# Patient Record
Sex: Female | Born: 1955
Health system: Southern US, Community
[De-identification: ages and names within clinical notes are randomized; demographics above are authoritative.]

## PROBLEM LIST (undated history)

## (undated) DIAGNOSIS — K08409 Partial loss of teeth, unspecified cause, unspecified class: Secondary | ICD-10-CM

## (undated) DIAGNOSIS — R748 Abnormal levels of other serum enzymes: Secondary | ICD-10-CM

## (undated) DIAGNOSIS — J449 Chronic obstructive pulmonary disease, unspecified: Secondary | ICD-10-CM

## (undated) DIAGNOSIS — E782 Mixed hyperlipidemia: Secondary | ICD-10-CM

## (undated) DIAGNOSIS — Z720 Tobacco use: Secondary | ICD-10-CM

## (undated) DIAGNOSIS — I10 Essential (primary) hypertension: Secondary | ICD-10-CM

## (undated) HISTORY — DX: Abnormal levels of other serum enzymes: R74.8

## (undated) HISTORY — PX: ABDOMINAL HYSTERECTOMY: SHX81

## (undated) HISTORY — DX: Partial loss of teeth, unspecified cause, unspecified class: K08.409

## (undated) HISTORY — DX: Mixed hyperlipidemia: E78.2

## (undated) HISTORY — DX: Essential (primary) hypertension: I10

---

## 2005-12-01 ENCOUNTER — Emergency Department (HOSPITAL_COMMUNITY): Admission: EM | Admit: 2005-12-01 | Discharge: 2005-12-01 | Payer: Self-pay | Admitting: Emergency Medicine

## 2006-05-08 ENCOUNTER — Ambulatory Visit (HOSPITAL_COMMUNITY): Admission: RE | Admit: 2006-05-08 | Discharge: 2006-05-08 | Payer: Self-pay | Admitting: Family Medicine

## 2006-05-29 ENCOUNTER — Ambulatory Visit (HOSPITAL_COMMUNITY): Admission: RE | Admit: 2006-05-29 | Discharge: 2006-05-29 | Payer: Self-pay | Admitting: Family Medicine

## 2007-02-10 ENCOUNTER — Ambulatory Visit: Payer: Self-pay | Admitting: Gastroenterology

## 2007-02-10 ENCOUNTER — Ambulatory Visit (HOSPITAL_COMMUNITY): Admission: RE | Admit: 2007-02-10 | Discharge: 2007-02-10 | Payer: Self-pay | Admitting: Gastroenterology

## 2007-02-19 ENCOUNTER — Ambulatory Visit (HOSPITAL_COMMUNITY): Admission: RE | Admit: 2007-02-19 | Discharge: 2007-02-19 | Payer: Self-pay | Admitting: Urology

## 2007-03-11 ENCOUNTER — Ambulatory Visit (HOSPITAL_COMMUNITY): Admission: RE | Admit: 2007-03-11 | Discharge: 2007-03-11 | Payer: Self-pay | Admitting: Family Medicine

## 2007-03-19 ENCOUNTER — Encounter (INDEPENDENT_AMBULATORY_CARE_PROVIDER_SITE_OTHER): Payer: Self-pay | Admitting: *Deleted

## 2007-03-19 ENCOUNTER — Ambulatory Visit (HOSPITAL_COMMUNITY): Admission: RE | Admit: 2007-03-19 | Discharge: 2007-03-19 | Payer: Self-pay | Admitting: Obstetrics & Gynecology

## 2007-06-27 ENCOUNTER — Ambulatory Visit (HOSPITAL_COMMUNITY): Admission: RE | Admit: 2007-06-27 | Discharge: 2007-06-27 | Payer: Self-pay | Admitting: Family Medicine

## 2007-12-19 ENCOUNTER — Ambulatory Visit (HOSPITAL_COMMUNITY): Admission: RE | Admit: 2007-12-19 | Discharge: 2007-12-19 | Payer: Self-pay | Admitting: Family Medicine

## 2008-07-20 ENCOUNTER — Ambulatory Visit (HOSPITAL_COMMUNITY): Admission: RE | Admit: 2008-07-20 | Discharge: 2008-07-20 | Payer: Self-pay | Admitting: Internal Medicine

## 2009-07-25 ENCOUNTER — Ambulatory Visit (HOSPITAL_COMMUNITY): Admission: RE | Admit: 2009-07-25 | Discharge: 2009-07-25 | Payer: Self-pay | Admitting: Family Medicine

## 2010-06-21 ENCOUNTER — Ambulatory Visit (HOSPITAL_COMMUNITY): Admission: RE | Admit: 2010-06-21 | Discharge: 2010-06-21 | Payer: Self-pay | Admitting: Family Medicine

## 2010-09-15 ENCOUNTER — Ambulatory Visit (HOSPITAL_COMMUNITY): Admission: RE | Admit: 2010-09-15 | Discharge: 2010-09-15 | Payer: Self-pay | Admitting: Family Medicine

## 2011-04-27 NOTE — H&P (Signed)
Cynthia [name], Cynthia [name]              ACCOUNT NO.:  [id number]   MEDICAL RECORD NO.:  [id number]          PATIENT TYPE:  AMB   LOCATION:  DAY                           FACILITY:  [facility]   PHYSICIAN:  Lazaro Arms, M.D.   DATE OF BIRTH:  September 23, 1956   DATE OF ADMISSION:  DATE OF DISCHARGE:  LH                              HISTORY & PHYSICAL   DATE OF SURGERY:  March 19, 2007.   HISTORY OF PRESENT ILLNESS:  Cynthia [name] is a 55 year old white female,  gravida 0, para 0, status post a hysterectomy about 20 years ago, who  has been having right lower quadrant pain intermittently the last  several months.  She went to see [facility] and she  ended up having a CT scan and ultrasound which showed a right ovarian  mass, otherwise was negative.  She has about a 4.5 cm right ovarian mass  complex, could be hemorrhagic corpus luteum, could not rule out a  malignancy.  However, there is no other free fluid.  There is no  studding, no other findings consistent.  She has had a hysterectomy and  removal of left tube and ovary at that time.  A CA-125 was drawn  preoperatively and we will proceed with a laparoscopic right salpingo-  oophorectomy.   PAST MEDICAL HISTORY:  Significant for hypertension.   MEDICATIONS:  She takes Toprol 50 mg and hydrochlorothiazide 25 mg.   PAST SURGICAL HISTORY:  She had a growth on her bowel that she had  resected and also a hysterectomy in the distant past.   REVIEW OF SYSTEMS:  Otherwise negative.   HABITS:  She smokes one to two packs of cigarettes a day.   SOCIAL HISTORY:  She works for the [employer].   PHYSICAL EXAMINATION:  VITAL SIGNS:  She is 5 feet, 6 inches, 180  pounds.  Blood pressure today in the office is 120/80.  HEENT:  Unremarkable.  NECK:  Thyroid is normal.  LUNGS:  Clear.  HEART:  Has regular rate and rhythm without murmurs, rubs or gallops.  BREASTS: Deferred.  ABDOMEN:  Benign.  PELVIC:  Deferred.  EXTREMITIES:  No edema.   CLINICAL DATA:  Ultrasound reveals the cyst and uterus and ovary absent.   IMPRESSION:  Painful complex right ovarian mass.   PLAN:  The patient is admitted for a laparoscopic right salpingo-  oophorectomy.  She understands the indications, benefits and risks and  will proceed.      Lazaro Arms, M.D.  Electronically Signed     LHE/MEDQ  D:  03/18/2007  T:  03/18/2007  Job:  [id number]

## 2011-04-27 NOTE — Op Note (Signed)
NAMEMAKAYLIN, CARLO              ACCOUNT NO.:  [id number]   MEDICAL RECORD NO.:  [id number]          PATIENT TYPE:  AMB   LOCATION:  DAY                           FACILITY:  [facility]   PHYSICIAN:  Kassie Mends, M.D.      DATE OF BIRTH:  15-Jan-1956   DATE OF PROCEDURE:  DATE OF DISCHARGE:                               OPERATIVE REPORT   PROCEDURE:  Colonoscopy.   INDICATION FOR EXAMINATION:  The patient is a 55 year old female who  presents for average risk colon cancer screening.   FINDINGS:  1. Normal colon without evidence of polyps, masses, diverticula,      inflammatory changes or AVMs.  2. Normal retroflexed view of the rectum.   RECOMMENDATIONS:  1. High-fiber diet.  The patient is given a handout on a high-fiber      diet.  The high-fiber diet is associated with a decreased risk of      colon polyps and colon cancer.  No Hemoccult testing for 5 years.  2. Screening colonoscopy in 10 years.  3. Follow up with Ms. Melony Overly.   MEDICATIONS:  1. Demerol 50 mg IV.  2. Versed 3 mg IV.   PROCEDURE TECHNIQUE:  Physical exam was performed and informed consent  was obtained from the patient after explaining the benefits, risks and  alternatives to the procedure.  The patient was connected to the monitor  and placed in the left lateral position.  Continuous oxygen was provided  by nasal cannula and IV medicine administered through the indwelling  cannula.  After administration of sedation and rectal exam, the  patient's rectum was intubated and the scope was advanced under direct  visualization to the cecum.  The scope was subsequently removed slowly  by carefully examining the color, texture, anatomy and integrity of the  mucosa on the way out.  The patient was recovered in the endoscopy suite  and discharged to home in satisfactory condition.      Kassie Mends, M.D.  Electronically Signed    SM/MEDQ  D:  02/10/2007  T:  02/10/2007  Job:  [id number]   cc:   Melony Overly,  PA

## 2011-04-27 NOTE — Op Note (Signed)
[name], Cynthia              ACCOUNT NO.:  [id number]   MEDICAL RECORD NO.:  [id number]          PATIENT TYPE:  AMB   LOCATION:  DAY                           FACILITY:  [facility]   PHYSICIAN:  Lazaro Arms, M.D.   DATE OF BIRTH:  09-Feb-1956   DATE OF PROCEDURE:  03/19/2007  DATE OF DISCHARGE:                               OPERATIVE REPORT   PREOPERATIVE DIAGNOSES:  1. Complex right ovarian tumor by ultrasound.  2. Right lower quadrant pain.   POSTOPERATIVE DIAGNOSES:  1. Complex right ovarian tumor by ultrasound.  2. Right lower quadrant pain.   PROCEDURE:  Right salpingo-oophorectomy.   SURGEON:  Lazaro Arms, M.D.   ANESTHESIA:  General endotracheal.   FINDINGS:  Patient is seen at [facility] and had right  lower quadrant.  Did a CT followed by an ultrasound that revealed a  complex right ovarian mass.  There were no other abnormalities on the  scan to suggest malignancy.  A CA-125 was done in our office, which has  returned as normal.  As a result, we will proceed with laparoscopic RSO.  At the time of surgery, it appears to be maybe a stromal cell tumor.  It  certainly does not appear to be malignant.  There are no excrescences.  No peritoneal studding.  No ascitic fluid, and the omentum appears to be  normal.  She did have adhesions from her previous bowel surgery in the  past.   DESCRIPTION OF OPERATION:  Patient was taken to the operating room,  placed in a supine position, underwent general endotracheal anesthesia.  Placed in the dorsal lithotomy position.  Prepped and draped in the  usual sterile fashion for laparoscopic procedure and a Foley catheter  was placed.  An incision was made in the umbilicus.  A Veress needle was  used.  The peritoneal cavity was insufflated.  Once the insufflation was  obtained, a non-bladed 11 mm trocar was used under direct visualization  with the video laparoscope.  Placed in the peritoneal cavity with one  pass without difficulty.  The peritoneal cavity was visualized.  Then 5  mm trocars were placed two fingerbreadths above the pubis in the midline  and also in the right lower quadrant under direct visualization without  difficulty.  The right ovary was identified.  It was adherent to some  bowel epiploic as well as pelvic side wall.  It was elevated under  traction.  The harmonic scalpel was used.  The infundibulopelvic  ligament was cauterized using the harmonic scalpel.  The adhesions were  then taken down from the epiploic.  Bowel was really not in the area at  all.  There was no chance of damage to that.  It was removed.  It was  placed in an EndoCatch bag and removed intact.  All epiploic was found  to be hemostatic.  Pelvis was irrigated.  The instruments were removed.  The gas was allowed to escape.  The umbilical fascia was closed with two  0 Vicryl sutures.  Subcutaneous tissue was also closed here, and the  skin  was closed using the skin staples x3.  The patient tolerated the  procedure well.  She experienced minimal blood loss, taken to the  recovery room in good, stable condition.  All counts were correct x3.  She received Ancef and Toradol prophylactically.  I will see her in the  office next week.      Lazaro Arms, M.D.  Electronically Signed     LHE/MEDQ  D:  03/19/2007  T:  03/19/2007  Job:  [id number]

## 2011-08-28 ENCOUNTER — Other Ambulatory Visit (HOSPITAL_COMMUNITY): Payer: Self-pay | Admitting: Family Medicine

## 2011-08-28 DIAGNOSIS — Z139 Encounter for screening, unspecified: Secondary | ICD-10-CM

## 2011-09-18 ENCOUNTER — Ambulatory Visit (HOSPITAL_COMMUNITY)
Admission: RE | Admit: 2011-09-18 | Discharge: 2011-09-18 | Disposition: A | Discharge status: Home / Self Care | Payer: Federal, State, Local not specified - PPO | Source: Ambulatory Visit | Attending: Family Medicine | Admitting: Family Medicine

## 2011-09-18 DIAGNOSIS — Z1231 Encounter for screening mammogram for malignant neoplasm of breast: Secondary | ICD-10-CM | POA: Insufficient documentation

## 2011-09-18 DIAGNOSIS — Z139 Encounter for screening, unspecified: Secondary | ICD-10-CM

## 2012-08-22 ENCOUNTER — Other Ambulatory Visit (HOSPITAL_COMMUNITY): Payer: Self-pay | Admitting: Family Medicine

## 2012-08-22 DIAGNOSIS — Z139 Encounter for screening, unspecified: Secondary | ICD-10-CM

## 2012-09-19 ENCOUNTER — Ambulatory Visit (HOSPITAL_COMMUNITY)
Admission: RE | Admit: 2012-09-19 | Discharge: 2012-09-19 | Disposition: A | Discharge status: Home / Self Care | Payer: Federal, State, Local not specified - PPO | Source: Ambulatory Visit | Attending: Family Medicine | Admitting: Family Medicine

## 2012-09-19 DIAGNOSIS — Z1231 Encounter for screening mammogram for malignant neoplasm of breast: Secondary | ICD-10-CM | POA: Insufficient documentation

## 2012-09-19 DIAGNOSIS — Z139 Encounter for screening, unspecified: Secondary | ICD-10-CM

## 2012-10-01 ENCOUNTER — Encounter (HOSPITAL_COMMUNITY): Payer: Self-pay | Admitting: Oncology

## 2012-10-01 ENCOUNTER — Encounter (HOSPITAL_COMMUNITY): Payer: Federal, State, Local not specified - PPO | Attending: Oncology | Admitting: Oncology

## 2012-10-01 VITALS — BP 129/79 | HR 75 | Temp 99.0°F | Resp 20 | Ht 65.0 in | Wt 194.5 lb

## 2012-10-01 DIAGNOSIS — D72829 Elevated white blood cell count, unspecified: Secondary | ICD-10-CM

## 2012-10-01 DIAGNOSIS — J4489 Other specified chronic obstructive pulmonary disease: Secondary | ICD-10-CM | POA: Insufficient documentation

## 2012-10-01 DIAGNOSIS — J449 Chronic obstructive pulmonary disease, unspecified: Secondary | ICD-10-CM

## 2012-10-01 LAB — CBC WITH DIFFERENTIAL/PLATELET
Basophils Absolute: 0.1 10*3/uL (ref 0.0–0.1)
Basophils Relative: 1 % (ref 0–1)
Eosinophils Absolute: 0.2 10*3/uL (ref 0.0–0.7)
Eosinophils Relative: 2 % (ref 0–5)
HCT: 48.1 % — ABNORMAL HIGH (ref 36.0–46.0)
Hemoglobin: 16.4 g/dL — ABNORMAL HIGH (ref 12.0–15.0)
Lymphocytes Relative: 39 % (ref 12–46)
Lymphs Abs: 4.5 10*3/uL — ABNORMAL HIGH (ref 0.7–4.0)
MCH: 32.9 pg (ref 26.0–34.0)
MCHC: 34.1 g/dL (ref 30.0–36.0)
MCV: 96.6 fL (ref 78.0–100.0)
Monocytes Absolute: 0.6 10*3/uL (ref 0.1–1.0)
Monocytes Relative: 6 % (ref 3–12)
Neutrophils Relative %: 53 % (ref 43–77)
Platelets: 242 10*3/uL (ref 150–400)
RBC: 4.98 MIL/uL (ref 3.87–5.11)
RDW: 12.7 % (ref 11.5–15.5)
WBC: 11.4 10*3/uL — ABNORMAL HIGH (ref 4.0–10.5)

## 2012-10-01 NOTE — Patient Instructions (Addendum)
[facility]  Discharge Instructions  RECOMMENDATIONS MADE BY THE CONSULTANT AND ANY TEST RESULTS WILL BE SENT TO YOUR REFERRING DOCTOR.   EXAM FINDINGS BY MD TODAY AND SIGNS AND SYMPTOMS TO REPORT TO CLINIC OR PRIMARY MD:  Exam and discussion by MD.   We will check your blood counts today and again in 6 months.  MD feels the elevation in your white blood count is nothing to worry about.  He would like for you to see a dentist to check your teeth because sometimes dental disease can cause elevation in your white blood count.  He also recommends that you stop smoking.  We will get a CT of your chest because of your smoking history.  MEDICATIONS PRESCRIBED: none   INSTRUCTIONS GIVEN AND DISCUSSED: Other :  Report recurring infections, fevers, night sweats, etc.  SPECIAL INSTRUCTIONS/FOLLOW-UP: Lab work Needed today and in 6 months, CT of Chest  and Return to Clinic in 6 months to see MD.   I acknowledge that I have been informed and understand all the instructions given to me and received a copy. I do not have any more questions at this time, but understand that I may call the Specialty Clinic at [facility] at [phone] during business hours should I have any further questions or need assistance in obtaining follow-up care.    __________________________________________  _____________  __________ Signature of Patient or Authorized Representative            Date                   Time    __________________________________________ Nurse's Signature

## 2012-10-01 NOTE — Progress Notes (Signed)
Cynthia [name] presented for labwork. Labs per MD order drawn via Peripheral Line 23 gauge needle inserted in left AC  Good blood return present. Procedure without incident.  Needle removed intact. Patient tolerated procedure well.

## 2012-10-01 NOTE — Progress Notes (Signed)
Problem #1 minimal leukocytosis Problem# 2 COPD with a greater than 60-pack-year history of smoking Problem #3 poor dental hygiene with what appears to be peridontal disease Problem #4 history of abdominal surgery as a child for a growth on her colon Problem #5 hysterectomy and oophorectomy completed as 2 separate procedures. Pleasant lady with mild elevation of her white count in 2012 and a repeat in 2013 was 12,100 with 4300 lymphocytes. She has a good hemoglobin back slightly higher than normal normal platelet count. She has no B. symptoms. She has not lost weight whatsoever. She does have a chronic cough which is not new or different. She has at least 60 and possibly 70 pack years history of smoking. She does not cough up blood. She has chronic dental problems, lost several teeth hasn't seen a dentist in well over a year. She does not use dental floss or brush her teeth twice a day on a regular basis. Mother still alive but was recently diagnosed with diabetes. Father is deceased from an MVA. She was never pregnant herself but has been married 30+ years. She was worked for the [employer] Oncologic review of systems otherwise is noncontributory. Vital signs are recorded. She remains overweight for her height. BMI is greater than 32. She has no lymphadenopathy. She does have several missing teeth and what appears to be peridontal disease. Teeth are in poor repair. Her tonsils appear to be absent. She cannot remember having a tonsillectomy however. She has a deep voice. Lungs show diminished breath sounds throughout. She has an increased AP chest diameter. Breast exam is negative for masses. Heart shows a regular rhythm and rate without murmur rub or gallop. Abdomen is obese without obvious hepatosplenomegaly. Bowel sounds are normal. She has scars in the lower abdomen that are intact. She has suprapubic incision and a midline incision. Legs are without edema. She has several benign appearing skin lesions. She is  tan in multiple areas. Pupils are equally round and reactive to light. Facial symmetry appears intact.  I suspect she has several reasons potentially for mild leukocytosis but we will just watch her at this time from that standpoint. The lymphocytosis is of interest but not diagnostic. I do not think she warrants a flow cytometric study at this time. She does however need a CT of her chest with a smoking history based upon the national lung cancer screening trial results. She has agreed to the CT scan and will come back and see [place] in 6 months for the white count evaluation

## 2012-10-07 ENCOUNTER — Ambulatory Visit (HOSPITAL_COMMUNITY)
Admission: RE | Admit: 2012-10-07 | Discharge: 2012-10-07 | Disposition: A | Discharge status: Home / Self Care | Payer: Federal, State, Local not specified - PPO | Source: Ambulatory Visit | Attending: Oncology | Admitting: Oncology

## 2012-10-07 DIAGNOSIS — R059 Cough, unspecified: Secondary | ICD-10-CM | POA: Insufficient documentation

## 2012-10-07 DIAGNOSIS — D72829 Elevated white blood cell count, unspecified: Secondary | ICD-10-CM | POA: Insufficient documentation

## 2012-10-07 DIAGNOSIS — R05 Cough: Secondary | ICD-10-CM | POA: Insufficient documentation

## 2012-10-07 DIAGNOSIS — J449 Chronic obstructive pulmonary disease, unspecified: Secondary | ICD-10-CM

## 2012-10-07 DIAGNOSIS — J4489 Other specified chronic obstructive pulmonary disease: Secondary | ICD-10-CM | POA: Insufficient documentation

## 2012-10-08 ENCOUNTER — Telehealth (HOSPITAL_COMMUNITY): Payer: Self-pay

## 2012-10-08 NOTE — Telephone Encounter (Signed)
Called patient to inform of scan results and that we would see her as scheduled.

## 2013-01-10 DIAGNOSIS — K08409 Partial loss of teeth, unspecified cause, unspecified class: Secondary | ICD-10-CM

## 2013-01-10 HISTORY — DX: Partial loss of teeth, unspecified cause, unspecified class: K08.409

## 2013-04-06 ENCOUNTER — Encounter (HOSPITAL_BASED_OUTPATIENT_CLINIC_OR_DEPARTMENT_OTHER): Payer: Federal, State, Local not specified - PPO

## 2013-04-06 ENCOUNTER — Encounter (HOSPITAL_COMMUNITY): Payer: Self-pay | Admitting: Dietician

## 2013-04-06 ENCOUNTER — Encounter (HOSPITAL_COMMUNITY): Payer: Federal, State, Local not specified - PPO | Attending: Oncology | Admitting: Oncology

## 2013-04-06 ENCOUNTER — Ambulatory Visit (HOSPITAL_COMMUNITY): Payer: Federal, State, Local not specified - PPO | Admitting: Oncology

## 2013-04-06 ENCOUNTER — Encounter (HOSPITAL_COMMUNITY): Payer: Self-pay | Admitting: Oncology

## 2013-04-06 ENCOUNTER — Other Ambulatory Visit (HOSPITAL_COMMUNITY): Payer: Federal, State, Local not specified - PPO

## 2013-04-06 VITALS — BP 114/74 | HR 51 | Temp 98.8°F | Resp 18 | Wt 192.9 lb

## 2013-04-06 DIAGNOSIS — D72829 Elevated white blood cell count, unspecified: Secondary | ICD-10-CM | POA: Insufficient documentation

## 2013-04-06 DIAGNOSIS — J449 Chronic obstructive pulmonary disease, unspecified: Secondary | ICD-10-CM

## 2013-04-06 DIAGNOSIS — J4489 Other specified chronic obstructive pulmonary disease: Secondary | ICD-10-CM | POA: Insufficient documentation

## 2013-04-06 DIAGNOSIS — F17200 Nicotine dependence, unspecified, uncomplicated: Secondary | ICD-10-CM

## 2013-04-06 LAB — CBC WITH DIFFERENTIAL/PLATELET
Basophils Absolute: 0.1 10*3/uL (ref 0.0–0.1)
Basophils Relative: 1 % (ref 0–1)
Eosinophils Absolute: 0.2 10*3/uL (ref 0.0–0.7)
HCT: 44 % (ref 36.0–46.0)
Hemoglobin: 15.6 g/dL — ABNORMAL HIGH (ref 12.0–15.0)
Lymphocytes Relative: 36 % (ref 12–46)
Lymphs Abs: 3.8 10*3/uL (ref 0.7–4.0)
MCH: 33.5 pg (ref 26.0–34.0)
MCHC: 35.5 g/dL (ref 30.0–36.0)
Monocytes Absolute: 0.7 10*3/uL (ref 0.1–1.0)
Monocytes Relative: 6 % (ref 3–12)
Neutro Abs: 5.8 10*3/uL (ref 1.7–7.7)
Neutrophils Relative %: 56 % (ref 43–77)
Platelets: 230 10*3/uL (ref 150–400)
RBC: 4.66 MIL/uL (ref 3.87–5.11)
RDW: 12.1 % (ref 11.5–15.5)

## 2013-04-06 NOTE — Progress Notes (Signed)
Labs drawn today for cbc/diff 

## 2013-04-06 NOTE — Patient Instructions (Addendum)
.  [facility] Discharge Instructions  RECOMMENDATIONS MADE BY THE CONSULTANT AND ANY TEST RESULTS WILL BE SENT TO YOUR REFERRING PHYSICIAN.  EXAM FINDINGS BY THE PHYSICIAN TODAY AND SIGNS OR SYMPTOMS TO REPORT TO CLINIC OR PRIMARY PHYSICIAN: labs are normal today. It may be because of your teeth being pulled.     INSTRUCTIONS GIVEN AND DISCUSSED: We will do labs in 6 months and CT scan  SPECIAL INSTRUCTIONS/FOLLOW-UP: See you back after scans  Thank you for choosing [facility] to provide your oncology and hematology care.  To afford each patient quality time with our providers, please arrive at least 15 minutes before your scheduled appointment time.  With your help, our goal is to use those 15 minutes to complete the necessary work-up to ensure our physicians have the information they need to help with your evaluation and healthcare recommendations.    Effective January 1st, 2014, we ask that you re-schedule your appointment with our physicians should you arrive 10 or more minutes late for your appointment.  We strive to give you quality time with our providers, and arriving late affects you and other patients whose appointments are after yours.    Again, thank you for choosing [facility].  Our hope is that these requests will decrease the amount of time that you wait before being seen by our physicians.       _____________________________________________________________  Should you have questions after your visit to [facility], please contact our office at [phone] between the hours of 8:30 a.m. and 5:00 p.m.  Voicemails left after 4:30 p.m. will not be returned until the following business day.  For prescription refill requests, have your pharmacy contact our office with your prescription refill request.

## 2013-04-06 NOTE — Progress Notes (Signed)
Problem #1 minimal leukocytosis which has now resolved with removal of her infected teeth Problem #2 COPD with a greater than 60-pack-year history of smoking, still smoking a pack a day, but her CAT scan in October showed no evidence of tumor Problem #3 poor dental hygiene which has been treated Problem #4 hysterectomy and oophorectomy completed as 2 separate operations in the past for benign reasons Problem #5 abdominal surgery as a child for a growth on her large intestine, no other information is available  Her counts have normalized. She no longer has a leukocytosis.  She does however need a low dose screening CT scan of her chest. She needs a CT scan this year and next year which will be 3 in a row.  I've asked her to consider quitting smoking altogether. She will try to work on that.  She also does not use sunscreen and is quite tanned today.  We will see her back in 6 months after that CT scan and blood work. I do want to check her white count one more time.

## 2013-04-06 NOTE — Progress Notes (Signed)
Pt identified for weight loss on [facility] Nutrition Screen.   Wt Readings from Last 10 Encounters:  04/06/13 192 lb 14.4 oz (87.499 kg)  10/01/12 194 lb 8 oz (88.225 kg)   Chart reviewed. Pt with leukocytosis. Pt also with peridontal disease due to poor dental hygeniene, recently underwent sx for removal of infected teeth.  Wt hx reveal 2# (0.1%) wt loss x 1 year. Wt stable. Wt change is not clinically significant.  Pt is not at risk for malnutrition at this time. If nutrition issue arise, please consult RD.   Melody Haver, RD, LDN Pager: [phone]

## 2013-08-25 ENCOUNTER — Other Ambulatory Visit (HOSPITAL_COMMUNITY): Payer: Self-pay | Admitting: Family Medicine

## 2013-08-25 DIAGNOSIS — Z139 Encounter for screening, unspecified: Secondary | ICD-10-CM

## 2013-09-21 ENCOUNTER — Ambulatory Visit (HOSPITAL_COMMUNITY)
Admission: RE | Admit: 2013-09-21 | Discharge: 2013-09-21 | Disposition: A | Discharge status: Home / Self Care | Payer: Federal, State, Local not specified - PPO | Source: Ambulatory Visit | Attending: Family Medicine | Admitting: Family Medicine

## 2013-09-21 DIAGNOSIS — Z1231 Encounter for screening mammogram for malignant neoplasm of breast: Secondary | ICD-10-CM | POA: Insufficient documentation

## 2013-09-21 DIAGNOSIS — Z139 Encounter for screening, unspecified: Secondary | ICD-10-CM

## 2013-09-28 ENCOUNTER — Other Ambulatory Visit: Payer: Self-pay | Admitting: Family Medicine

## 2013-09-28 DIAGNOSIS — R928 Other abnormal and inconclusive findings on diagnostic imaging of breast: Secondary | ICD-10-CM

## 2013-10-07 ENCOUNTER — Ambulatory Visit (HOSPITAL_COMMUNITY)
Admission: RE | Admit: 2013-10-07 | Discharge: 2013-10-07 | Disposition: A | Discharge status: Home / Self Care | Payer: Federal, State, Local not specified - PPO | Source: Ambulatory Visit | Attending: Family Medicine | Admitting: Family Medicine

## 2013-10-07 ENCOUNTER — Ambulatory Visit (HOSPITAL_COMMUNITY)
Admission: RE | Admit: 2013-10-07 | Discharge: 2013-10-07 | Disposition: A | Discharge status: Home / Self Care | Payer: Federal, State, Local not specified - PPO | Source: Ambulatory Visit | Attending: Oncology | Admitting: Oncology

## 2013-10-07 ENCOUNTER — Other Ambulatory Visit (HOSPITAL_COMMUNITY): Payer: Self-pay | Admitting: Oncology

## 2013-10-07 DIAGNOSIS — D72829 Elevated white blood cell count, unspecified: Secondary | ICD-10-CM

## 2013-10-07 DIAGNOSIS — R928 Other abnormal and inconclusive findings on diagnostic imaging of breast: Secondary | ICD-10-CM | POA: Insufficient documentation

## 2013-10-07 DIAGNOSIS — J449 Chronic obstructive pulmonary disease, unspecified: Secondary | ICD-10-CM

## 2013-10-16 ENCOUNTER — Other Ambulatory Visit (HOSPITAL_COMMUNITY): Payer: Federal, State, Local not specified - PPO

## 2013-10-19 ENCOUNTER — Ambulatory Visit (HOSPITAL_COMMUNITY): Payer: Federal, State, Local not specified - PPO

## 2013-10-21 ENCOUNTER — Ambulatory Visit (HOSPITAL_COMMUNITY): Payer: Federal, State, Local not specified - PPO | Admitting: Oncology

## 2013-11-13 ENCOUNTER — Other Ambulatory Visit (HOSPITAL_COMMUNITY): Payer: Self-pay | Admitting: Nephrology

## 2013-11-13 DIAGNOSIS — N289 Disorder of kidney and ureter, unspecified: Secondary | ICD-10-CM

## 2013-12-16 ENCOUNTER — Ambulatory Visit (HOSPITAL_COMMUNITY)
Admission: RE | Admit: 2013-12-16 | Discharge: 2013-12-16 | Disposition: A | Discharge status: Home / Self Care | Payer: Federal, State, Local not specified - PPO | Source: Ambulatory Visit | Attending: Nephrology | Admitting: Nephrology

## 2013-12-16 DIAGNOSIS — N289 Disorder of kidney and ureter, unspecified: Secondary | ICD-10-CM

## 2014-04-08 ENCOUNTER — Other Ambulatory Visit (HOSPITAL_COMMUNITY): Payer: Self-pay | Admitting: Family Medicine

## 2014-04-08 DIAGNOSIS — N6310 Unspecified lump in the right breast, unspecified quadrant: Secondary | ICD-10-CM

## 2014-04-08 DIAGNOSIS — Z09 Encounter for follow-up examination after completed treatment for conditions other than malignant neoplasm: Secondary | ICD-10-CM

## 2014-04-20 ENCOUNTER — Ambulatory Visit (HOSPITAL_COMMUNITY)
Admission: RE | Admit: 2014-04-20 | Discharge: 2014-04-20 | Disposition: A | Discharge status: Home / Self Care | Payer: Federal, State, Local not specified - PPO | Source: Ambulatory Visit | Attending: Family Medicine | Admitting: Family Medicine

## 2014-04-20 DIAGNOSIS — R928 Other abnormal and inconclusive findings on diagnostic imaging of breast: Secondary | ICD-10-CM | POA: Insufficient documentation

## 2014-04-20 DIAGNOSIS — Z09 Encounter for follow-up examination after completed treatment for conditions other than malignant neoplasm: Secondary | ICD-10-CM

## 2014-04-20 DIAGNOSIS — N6310 Unspecified lump in the right breast, unspecified quadrant: Secondary | ICD-10-CM

## 2014-04-28 ENCOUNTER — Encounter (HOSPITAL_COMMUNITY): Payer: Federal, State, Local not specified - PPO

## 2014-10-08 ENCOUNTER — Other Ambulatory Visit (HOSPITAL_COMMUNITY): Payer: Self-pay | Admitting: Physician Assistant

## 2014-10-08 DIAGNOSIS — Z09 Encounter for follow-up examination after completed treatment for conditions other than malignant neoplasm: Secondary | ICD-10-CM

## 2014-10-26 ENCOUNTER — Ambulatory Visit (HOSPITAL_COMMUNITY)
Admission: RE | Admit: 2014-10-26 | Discharge: 2014-10-26 | Disposition: A | Discharge status: Home / Self Care | Payer: Federal, State, Local not specified - PPO | Source: Ambulatory Visit | Attending: Physician Assistant | Admitting: Physician Assistant

## 2014-10-26 DIAGNOSIS — Z09 Encounter for follow-up examination after completed treatment for conditions other than malignant neoplasm: Secondary | ICD-10-CM | POA: Diagnosis not present

## 2014-10-26 DIAGNOSIS — N6489 Other specified disorders of breast: Secondary | ICD-10-CM | POA: Diagnosis not present

## 2015-09-22 ENCOUNTER — Other Ambulatory Visit (HOSPITAL_COMMUNITY): Payer: Self-pay | Admitting: Family Medicine

## 2015-09-22 DIAGNOSIS — Z1231 Encounter for screening mammogram for malignant neoplasm of breast: Secondary | ICD-10-CM

## 2015-10-31 ENCOUNTER — Ambulatory Visit (HOSPITAL_COMMUNITY)
Admission: RE | Admit: 2015-10-31 | Discharge: 2015-10-31 | Disposition: A | Discharge status: Home / Self Care | Payer: Federal, State, Local not specified - PPO | Source: Ambulatory Visit | Attending: Family Medicine | Admitting: Family Medicine

## 2015-10-31 DIAGNOSIS — Z1231 Encounter for screening mammogram for malignant neoplasm of breast: Secondary | ICD-10-CM

## 2016-09-25 DIAGNOSIS — Z1389 Encounter for screening for other disorder: Secondary | ICD-10-CM | POA: Diagnosis not present

## 2016-09-25 DIAGNOSIS — Z23 Encounter for immunization: Secondary | ICD-10-CM | POA: Diagnosis not present

## 2016-09-25 DIAGNOSIS — Z0000 Encounter for general adult medical examination without abnormal findings: Secondary | ICD-10-CM | POA: Diagnosis not present

## 2016-09-25 DIAGNOSIS — Z6831 Body mass index (BMI) 31.0-31.9, adult: Secondary | ICD-10-CM | POA: Diagnosis not present

## 2016-09-25 DIAGNOSIS — I10 Essential (primary) hypertension: Secondary | ICD-10-CM | POA: Diagnosis not present

## 2016-09-25 DIAGNOSIS — N181 Chronic kidney disease, stage 1: Secondary | ICD-10-CM | POA: Diagnosis not present

## 2016-09-25 DIAGNOSIS — E6609 Other obesity due to excess calories: Secondary | ICD-10-CM | POA: Diagnosis not present

## 2016-09-27 ENCOUNTER — Other Ambulatory Visit (HOSPITAL_COMMUNITY): Payer: Self-pay | Admitting: Registered Nurse

## 2016-09-27 DIAGNOSIS — Z1231 Encounter for screening mammogram for malignant neoplasm of breast: Secondary | ICD-10-CM

## 2016-10-31 ENCOUNTER — Ambulatory Visit (HOSPITAL_COMMUNITY): Payer: Federal, State, Local not specified - PPO

## 2017-01-14 ENCOUNTER — Telehealth: Payer: Self-pay | Admitting: Gastroenterology

## 2017-01-14 NOTE — Telephone Encounter (Signed)
Recall for tcs °

## 2017-01-15 NOTE — Telephone Encounter (Signed)
Letter mailed to pt.  

## 2017-02-01 ENCOUNTER — Other Ambulatory Visit (HOSPITAL_COMMUNITY): Payer: Self-pay | Admitting: Registered Nurse

## 2017-02-01 DIAGNOSIS — Z1231 Encounter for screening mammogram for malignant neoplasm of breast: Secondary | ICD-10-CM

## 2017-02-04 ENCOUNTER — Ambulatory Visit (HOSPITAL_COMMUNITY)
Admission: RE | Admit: 2017-02-04 | Discharge: 2017-02-04 | Disposition: A | Discharge status: Home / Self Care | Payer: Federal, State, Local not specified - PPO | Source: Ambulatory Visit | Attending: Registered Nurse | Admitting: Registered Nurse

## 2017-02-04 DIAGNOSIS — Z1231 Encounter for screening mammogram for malignant neoplasm of breast: Secondary | ICD-10-CM | POA: Insufficient documentation

## 2017-05-05 DIAGNOSIS — Z1211 Encounter for screening for malignant neoplasm of colon: Secondary | ICD-10-CM | POA: Diagnosis not present

## 2017-07-03 DIAGNOSIS — D509 Iron deficiency anemia, unspecified: Secondary | ICD-10-CM | POA: Diagnosis not present

## 2017-07-03 DIAGNOSIS — D649 Anemia, unspecified: Secondary | ICD-10-CM | POA: Diagnosis not present

## 2017-07-03 DIAGNOSIS — I10 Essential (primary) hypertension: Secondary | ICD-10-CM | POA: Diagnosis not present

## 2017-07-03 DIAGNOSIS — N183 Chronic kidney disease, stage 3 (moderate): Secondary | ICD-10-CM | POA: Diagnosis not present

## 2017-07-09 DIAGNOSIS — Z720 Tobacco use: Secondary | ICD-10-CM | POA: Diagnosis not present

## 2017-07-09 DIAGNOSIS — R809 Proteinuria, unspecified: Secondary | ICD-10-CM | POA: Diagnosis not present

## 2017-07-09 DIAGNOSIS — N182 Chronic kidney disease, stage 2 (mild): Secondary | ICD-10-CM | POA: Diagnosis not present

## 2017-07-09 DIAGNOSIS — I10 Essential (primary) hypertension: Secondary | ICD-10-CM | POA: Diagnosis not present

## 2017-11-11 ENCOUNTER — Other Ambulatory Visit (HOSPITAL_COMMUNITY): Payer: Self-pay | Admitting: Physician Assistant

## 2017-11-11 ENCOUNTER — Ambulatory Visit (HOSPITAL_COMMUNITY)
Admission: RE | Admit: 2017-11-11 | Discharge: 2017-11-11 | Disposition: A | Discharge status: Home / Self Care | Payer: Federal, State, Local not specified - PPO | Source: Ambulatory Visit | Attending: Physician Assistant | Admitting: Physician Assistant

## 2017-11-11 DIAGNOSIS — Z0001 Encounter for general adult medical examination with abnormal findings: Secondary | ICD-10-CM | POA: Insufficient documentation

## 2017-11-11 DIAGNOSIS — R05 Cough: Secondary | ICD-10-CM

## 2017-11-11 DIAGNOSIS — R059 Cough, unspecified: Secondary | ICD-10-CM

## 2017-11-11 DIAGNOSIS — Z6834 Body mass index (BMI) 34.0-34.9, adult: Secondary | ICD-10-CM | POA: Diagnosis not present

## 2017-11-11 DIAGNOSIS — R739 Hyperglycemia, unspecified: Secondary | ICD-10-CM | POA: Diagnosis not present

## 2017-11-11 DIAGNOSIS — Z1389 Encounter for screening for other disorder: Secondary | ICD-10-CM | POA: Insufficient documentation

## 2017-11-11 DIAGNOSIS — Z23 Encounter for immunization: Secondary | ICD-10-CM | POA: Diagnosis not present

## 2017-11-11 DIAGNOSIS — F17290 Nicotine dependence, other tobacco product, uncomplicated: Secondary | ICD-10-CM | POA: Diagnosis not present

## 2017-11-11 DIAGNOSIS — R748 Abnormal levels of other serum enzymes: Secondary | ICD-10-CM | POA: Diagnosis not present

## 2017-11-11 DIAGNOSIS — Z719 Counseling, unspecified: Secondary | ICD-10-CM | POA: Diagnosis not present

## 2017-11-11 DIAGNOSIS — Z124 Encounter for screening for malignant neoplasm of cervix: Secondary | ICD-10-CM | POA: Diagnosis not present

## 2017-11-11 DIAGNOSIS — E6609 Other obesity due to excess calories: Secondary | ICD-10-CM | POA: Diagnosis not present

## 2017-11-11 DIAGNOSIS — J40 Bronchitis, not specified as acute or chronic: Secondary | ICD-10-CM | POA: Diagnosis not present

## 2017-11-13 DIAGNOSIS — Z124 Encounter for screening for malignant neoplasm of cervix: Secondary | ICD-10-CM | POA: Diagnosis not present

## 2017-11-13 DIAGNOSIS — R748 Abnormal levels of other serum enzymes: Secondary | ICD-10-CM | POA: Diagnosis not present

## 2017-11-13 DIAGNOSIS — Z0001 Encounter for general adult medical examination with abnormal findings: Secondary | ICD-10-CM | POA: Diagnosis not present

## 2017-11-13 DIAGNOSIS — R739 Hyperglycemia, unspecified: Secondary | ICD-10-CM | POA: Diagnosis not present

## 2017-12-06 DIAGNOSIS — E871 Hypo-osmolality and hyponatremia: Secondary | ICD-10-CM | POA: Diagnosis not present

## 2017-12-06 DIAGNOSIS — I700 Atherosclerosis of aorta: Secondary | ICD-10-CM | POA: Diagnosis not present

## 2017-12-06 DIAGNOSIS — D3502 Benign neoplasm of left adrenal gland: Secondary | ICD-10-CM | POA: Diagnosis not present

## 2017-12-06 DIAGNOSIS — R945 Abnormal results of liver function studies: Secondary | ICD-10-CM | POA: Diagnosis not present

## 2018-02-22 ENCOUNTER — Emergency Department (HOSPITAL_COMMUNITY): Payer: Federal, State, Local not specified - PPO

## 2018-02-22 ENCOUNTER — Inpatient Hospital Stay (HOSPITAL_COMMUNITY)
Admission: EM | Admit: 2018-02-22 | Discharge: 2018-02-24 | DRG: 193 | Disposition: A | Discharge status: Home / Self Care | Payer: Federal, State, Local not specified - PPO | Attending: Family Medicine | Admitting: Family Medicine

## 2018-02-22 ENCOUNTER — Encounter (HOSPITAL_COMMUNITY): Payer: Self-pay | Admitting: Emergency Medicine

## 2018-02-22 DIAGNOSIS — Z720 Tobacco use: Secondary | ICD-10-CM | POA: Diagnosis present

## 2018-02-22 DIAGNOSIS — Z23 Encounter for immunization: Secondary | ICD-10-CM

## 2018-02-22 DIAGNOSIS — T502X5A Adverse effect of carbonic-anhydrase inhibitors, benzothiadiazides and other diuretics, initial encounter: Secondary | ICD-10-CM | POA: Diagnosis not present

## 2018-02-22 DIAGNOSIS — F17210 Nicotine dependence, cigarettes, uncomplicated: Secondary | ICD-10-CM | POA: Diagnosis not present

## 2018-02-22 DIAGNOSIS — D7289 Other specified disorders of white blood cells: Secondary | ICD-10-CM | POA: Diagnosis present

## 2018-02-22 DIAGNOSIS — Y92230 Patient room in hospital as the place of occurrence of the external cause: Secondary | ICD-10-CM | POA: Diagnosis not present

## 2018-02-22 DIAGNOSIS — J181 Lobar pneumonia, unspecified organism: Principal | ICD-10-CM | POA: Diagnosis present

## 2018-02-22 DIAGNOSIS — R05 Cough: Secondary | ICD-10-CM | POA: Diagnosis not present

## 2018-02-22 DIAGNOSIS — R888 Abnormal findings in other body fluids and substances: Secondary | ICD-10-CM | POA: Diagnosis not present

## 2018-02-22 DIAGNOSIS — J45909 Unspecified asthma, uncomplicated: Secondary | ICD-10-CM | POA: Diagnosis present

## 2018-02-22 DIAGNOSIS — J440 Chronic obstructive pulmonary disease with acute lower respiratory infection: Secondary | ICD-10-CM | POA: Diagnosis not present

## 2018-02-22 DIAGNOSIS — Z833 Family history of diabetes mellitus: Secondary | ICD-10-CM

## 2018-02-22 DIAGNOSIS — J449 Chronic obstructive pulmonary disease, unspecified: Secondary | ICD-10-CM | POA: Diagnosis present

## 2018-02-22 DIAGNOSIS — E871 Hypo-osmolality and hyponatremia: Secondary | ICD-10-CM | POA: Diagnosis not present

## 2018-02-22 DIAGNOSIS — R740 Nonspecific elevation of levels of transaminase and lactic acid dehydrogenase [LDH]: Secondary | ICD-10-CM | POA: Diagnosis not present

## 2018-02-22 DIAGNOSIS — Z90710 Acquired absence of both cervix and uterus: Secondary | ICD-10-CM | POA: Diagnosis not present

## 2018-02-22 DIAGNOSIS — Z823 Family history of stroke: Secondary | ICD-10-CM

## 2018-02-22 DIAGNOSIS — R0609 Other forms of dyspnea: Secondary | ICD-10-CM | POA: Diagnosis not present

## 2018-02-22 DIAGNOSIS — E7800 Pure hypercholesterolemia, unspecified: Secondary | ICD-10-CM | POA: Diagnosis present

## 2018-02-22 DIAGNOSIS — J9601 Acute respiratory failure with hypoxia: Secondary | ICD-10-CM | POA: Diagnosis not present

## 2018-02-22 DIAGNOSIS — J189 Pneumonia, unspecified organism: Secondary | ICD-10-CM

## 2018-02-22 DIAGNOSIS — E781 Pure hyperglyceridemia: Secondary | ICD-10-CM | POA: Diagnosis present

## 2018-02-22 DIAGNOSIS — R7401 Elevation of levels of liver transaminase levels: Secondary | ICD-10-CM | POA: Diagnosis present

## 2018-02-22 DIAGNOSIS — Z79899 Other long term (current) drug therapy: Secondary | ICD-10-CM

## 2018-02-22 DIAGNOSIS — J441 Chronic obstructive pulmonary disease with (acute) exacerbation: Secondary | ICD-10-CM | POA: Diagnosis not present

## 2018-02-22 DIAGNOSIS — I10 Essential (primary) hypertension: Secondary | ICD-10-CM | POA: Diagnosis not present

## 2018-02-22 DIAGNOSIS — Z7982 Long term (current) use of aspirin: Secondary | ICD-10-CM

## 2018-02-22 DIAGNOSIS — R059 Cough, unspecified: Secondary | ICD-10-CM

## 2018-02-22 DIAGNOSIS — E785 Hyperlipidemia, unspecified: Secondary | ICD-10-CM | POA: Diagnosis not present

## 2018-02-22 DIAGNOSIS — I110 Hypertensive heart disease with heart failure: Secondary | ICD-10-CM | POA: Diagnosis not present

## 2018-02-22 DIAGNOSIS — B349 Viral infection, unspecified: Secondary | ICD-10-CM | POA: Diagnosis present

## 2018-02-22 HISTORY — DX: Tobacco use: Z72.0

## 2018-02-22 LAB — PHOSPHORUS: Phosphorus: 3 mg/dL (ref 2.5–4.6)

## 2018-02-22 LAB — COMPREHENSIVE METABOLIC PANEL
ALK PHOS: 40 U/L (ref 38–126)
ALT: 57 U/L — AB (ref 14–54)
AST: 93 U/L — AB (ref 15–41)
Albumin: 3.3 g/dL — ABNORMAL LOW (ref 3.5–5.0)
Anion gap: 11 (ref 5–15)
BUN: 13 mg/dL (ref 6–20)
CALCIUM: 8.6 mg/dL — AB (ref 8.9–10.3)
CO2: 31 mmol/L (ref 22–32)
CREATININE: 0.66 mg/dL (ref 0.44–1.00)
Chloride: 74 mmol/L — ABNORMAL LOW (ref 101–111)
Glucose, Bld: 131 mg/dL — ABNORMAL HIGH (ref 65–99)
Potassium: 3.7 mmol/L (ref 3.5–5.1)
SODIUM: 116 mmol/L — AB (ref 135–145)
Total Bilirubin: 0.9 mg/dL (ref 0.3–1.2)
Total Protein: 6.7 g/dL (ref 6.5–8.1)

## 2018-02-22 LAB — CBC WITH DIFFERENTIAL/PLATELET
BASOS PCT: 1 %
Basophils Absolute: 0.1 10*3/uL (ref 0.0–0.1)
EOS PCT: 1 %
Eosinophils Absolute: 0.1 10*3/uL (ref 0.0–0.7)
HCT: 39.9 % (ref 36.0–46.0)
HEMOGLOBIN: 13.9 g/dL (ref 12.0–15.0)
LYMPHS PCT: 19 %
Lymphs Abs: 1.2 10*3/uL (ref 0.7–4.0)
MCH: 30.1 pg (ref 26.0–34.0)
MCHC: 34.8 g/dL (ref 30.0–36.0)
MCV: 86.4 fL (ref 78.0–100.0)
Monocytes Absolute: 0.8 10*3/uL (ref 0.1–1.0)
Monocytes Relative: 12 %
NEUTROS PCT: 67 %
Neutro Abs: 4.2 10*3/uL (ref 1.7–7.7)
Platelets: 154 10*3/uL (ref 150–400)
RBC: 4.62 MIL/uL (ref 3.87–5.11)
RDW: 12.2 % (ref 11.5–15.5)
WBC: 6.4 10*3/uL (ref 4.0–10.5)

## 2018-02-22 LAB — TROPONIN I: Troponin I: <0.03 ng/mL (ref <0.03)

## 2018-02-22 LAB — MAGNESIUM: Magnesium: 1.7 mg/dL (ref 1.7–2.4)

## 2018-02-22 MED ORDER — SODIUM CHLORIDE 0.9 % IV SOLN
1.0000 g | INTRAVENOUS | Status: DC
  Administered 2018-02-23: 1 g via INTRAVENOUS
  Filled 2018-02-22 (×3): qty 10

## 2018-02-22 MED ORDER — SODIUM CHLORIDE 0.9 % IV SOLN
500.0000 mg | INTRAVENOUS | Status: DC
  Administered 2018-02-23: 500 mg via INTRAVENOUS
  Filled 2018-02-22 (×3): qty 500

## 2018-02-22 MED ORDER — ALBUTEROL SULFATE (2.5 MG/3ML) 0.083% IN NEBU
2.5000 mg | INHALATION_SOLUTION | Freq: Four times a day (QID) | RESPIRATORY_TRACT | Status: DC
  Administered 2018-02-23: 2.5 mg via RESPIRATORY_TRACT
  Filled 2018-02-22: qty 3

## 2018-02-22 MED ORDER — SODIUM CHLORIDE 0.9 % IV SOLN
1.0000 g | Freq: Once | INTRAVENOUS | Status: AC
  Administered 2018-02-22: 1 g via INTRAVENOUS
  Filled 2018-02-22: qty 10

## 2018-02-22 MED ORDER — IPRATROPIUM BROMIDE 0.02 % IN SOLN
1.0000 mg | Freq: Once | RESPIRATORY_TRACT | Status: AC
  Administered 2018-02-22: 1 mg via RESPIRATORY_TRACT
  Filled 2018-02-22: qty 5

## 2018-02-22 MED ORDER — MAGNESIUM SULFATE 2 GM/50ML IV SOLN
2.0000 g | Freq: Once | INTRAVENOUS | Status: AC
  Administered 2018-02-22: 2 g via INTRAVENOUS
  Filled 2018-02-22: qty 50

## 2018-02-22 MED ORDER — ALBUTEROL (5 MG/ML) CONTINUOUS INHALATION SOLN
15.0000 mg/h | INHALATION_SOLUTION | RESPIRATORY_TRACT | Status: DC
  Administered 2018-02-22: 15 mg/h via RESPIRATORY_TRACT
  Filled 2018-02-22: qty 20

## 2018-02-22 MED ORDER — POTASSIUM CHLORIDE IN NACL 20-0.9 MEQ/L-% IV SOLN
INTRAVENOUS | Status: DC
  Administered 2018-02-23: via INTRAVENOUS
  Filled 2018-02-22: qty 1000

## 2018-02-22 MED ORDER — ENOXAPARIN SODIUM 40 MG/0.4ML ~~LOC~~ SOLN
40.0000 mg | SUBCUTANEOUS | Status: DC
  Administered 2018-02-23 (×2): 40 mg via SUBCUTANEOUS
  Filled 2018-02-22 (×2): qty 0.4

## 2018-02-22 MED ORDER — ONDANSETRON HCL 4 MG PO TABS: 4.0000 mg | ORAL_TABLET | Freq: Four times a day (QID) | ORAL | Status: DC | PRN

## 2018-02-22 MED ORDER — ONDANSETRON HCL 4 MG/2ML IJ SOLN: 4.0000 mg | Freq: Four times a day (QID) | INTRAMUSCULAR | Status: DC | PRN

## 2018-02-22 MED ORDER — ACETAMINOPHEN 325 MG PO TABS: 650.0000 mg | ORAL_TABLET | Freq: Four times a day (QID) | ORAL | Status: DC | PRN

## 2018-02-22 MED ORDER — ACETAMINOPHEN 650 MG RE SUPP: 650.0000 mg | Freq: Four times a day (QID) | RECTAL | Status: DC | PRN

## 2018-02-22 MED ORDER — PREDNISONE 50 MG PO TABS
60.0000 mg | ORAL_TABLET | Freq: Once | ORAL | Status: AC
  Administered 2018-02-22: 60 mg via ORAL
  Filled 2018-02-22: qty 1

## 2018-02-22 MED ORDER — AZITHROMYCIN 250 MG PO TABS
500.0000 mg | ORAL_TABLET | Freq: Once | ORAL | Status: AC
  Administered 2018-02-22: 500 mg via ORAL
  Filled 2018-02-22: qty 2

## 2018-02-22 MED ORDER — ALBUTEROL SULFATE (2.5 MG/3ML) 0.083% IN NEBU: 2.5000 mg | INHALATION_SOLUTION | RESPIRATORY_TRACT | Status: DC | PRN

## 2018-02-22 MED ORDER — IPRATROPIUM BROMIDE 0.02 % IN SOLN
0.5000 mg | Freq: Four times a day (QID) | RESPIRATORY_TRACT | Status: DC
  Administered 2018-02-23: 0.5 mg via RESPIRATORY_TRACT
  Filled 2018-02-22: qty 2.5

## 2018-02-22 NOTE — H&P (Signed)
History and Physical    Cynthia [name] ZOX:[id number] DOB: 12/21/1955 DOA: 02/22/2018  PCP: Shawnie DapperMann, Benjamin L, PA-C   Patient coming from: Home.  I have personally briefly reviewed patient's old medical records in [facility]  Chief Complaint: Cough and body aches for 5 days.  HPI: Cynthia [name] is a 62 y.o. female with medical history significant of hyperlipidemia, hypertension, tobacco use who is coming to the emergency department with complaints of orthopnea, cough and body aches for 5 days.  O2 sat was 78% initially in triage.  Per patient, for the past 5-6 days she has been having dyspnea, cough with productive whitish, occasionally yellowish brown sputum, fatigue, malaise, decreased appetite, pleuritic left lower lateral chest wall and body aches.  She denies travel history or known sick contacts.  She denies fever, chills, night sweats or hemoptysis.  No typical chest pain, palpitations, dizziness, diaphoresis, PND, but complains of recent lower extremity edema and orthopnea.  No abdominal pain, nausea, emesis, constipation, melena or hematochezia.  She states that she had one episode of loose stools several days ago, but none since then.  She denies dysuria, frequency or hematuria.  No polyphagia, polydipsia, polyuria, heat or cold intolerance.  She denies skin rashes or pruritus.  ED Course: Initial vital signs temperature 98.70F, pulse 67, respirations 19, blood pressure 137/88 mmHg and O2 sat 78% on room air.  The patient received supplemental oxygen, magnesium sulfate 2 g IVPB, several neb treatments totaling 15 mg of albuterol and 1.5 mg of ipratropium, ceftriaxone 1 g IVPB and azithromycin 500 mg IVPB.  EKG was sinus rhythm at baseline wander in lead V3.Troponin was normal.  Her CBC values were normal, except for WBC morphology which showed atypical lymphocytes.  Sodium 116, potassium 3.7, chloride 74 and CO2 31 millimolar/L.  Her glucose is 131, BUN 13, creatinine 0.66,  calcium 8.6, magnesium 1.7 and phosphorus 3.0 mg/dL.  Albumin was 3.3 g/dL.  AST was 93 and ALT 57 U/L.  The rest of the liver function tests were normal.  Imaging: Her chest radiograph shows a hazy right lung base opacity suspicious for pneumonia.  Please see images and full radiology report for further detail.  Follow-up imaging is recommended to be done in 3-4 weeks after antibiotic therapy is given.  Review of Systems: As per HPI otherwise 10 point review of systems negative.    Past Medical History:  Diagnosis Date  . Elevated triglycerides with high cholesterol   . Hypertension   . S/P tooth extraction Feb 2014   for dentures  . Tobacco use     Past Surgical History:  Procedure Laterality Date  . ABDOMINAL HYSTERECTOMY     2 partial     reports that she has been smoking cigarettes.  She has a 30.00 pack-year smoking history. she has never used smokeless tobacco. She reports that she does not drink alcohol or use drugs.  No Known Allergies  Family History  Problem Relation Age of Onset  . Diabetes Mother   . Stroke Maternal Grandmother     Prior to Admission medications   Medication Sig Start Date End Date Taking? Authorizing Provider  aspirin 325 MG EC tablet Take 325 mg by mouth daily.    [provider]  estrogens, conjugated, (PREMARIN) 0.625 MG tablet Take 0.625 mg by mouth daily. Take daily for 21 days then do not take for 7 days.    [provider]  fenofibrate 160 MG tablet Take 160 mg by  mouth daily.    [provider]  hydrochlorothiazide (HYDRODIURIL) 25 MG tablet Take 25 mg by mouth daily.    [provider]  metoprolol succinate (TOPROL-XL) 50 MG 24 hr tablet Take 50 mg by mouth daily. Take with or immediately following a meal.    [provider]  niacin (NIASPAN) 500 MG CR tablet Take 1,000 mg by mouth at bedtime.    [provider]    Physical Exam: Vitals:   02/22/18 2144 02/22/18 2159 02/22/18 2200  02/22/18 2230  BP:  (!) 149/80 139/85 (!) 146/80  Pulse:  65 65 73  Resp:  18 18 18   Temp:      TempSrc:      SpO2: 95%  100% 100%  Weight:      Height:        Constitutional: Looks acutely ill, but in no present distress. Eyes: PERRL, lids and conjunctivae normal ENMT: Mucous membranes are mildly dry. Posterior pharynx clear of any exudate or lesions. Lower and upper dentures present. Neck: normal, supple, no masses, no thyromegaly Respiratory: Decreased breath sounds with wheezing bilaterally, no crackles. Normal respiratory effort. No accessory muscle use.  Cardiovascular: Regular rate and rhythm, no murmurs / rubs / gallops. No extremity edema. 2+ pedal pulses. No carotid bruits.  Abdomen: Soft, no tenderness, no masses palpated. No hepatosplenomegaly. Bowel sounds positive.  Musculoskeletal: no clubbing / cyanosis. Good ROM, no contractures. Normal muscle tone.  Skin: Multiple Small macules on hands and upper back Neurologic: CN 2-12 grossly intact. Sensation intact, DTR normal. Strength 5/5 in all 4.  Psychiatric: Normal judgment and insight. Alert and oriented x 3. Normal mood.    Labs on Admission: I have personally reviewed following labs and imaging studies  CBC: Recent Labs  Lab 02/22/18 2221  WBC 6.4  NEUTROABS 4.2  HGB 13.9  HCT 39.9  MCV 86.4  PLT 154   Basic Metabolic Panel: Recent Labs  Lab 02/22/18 2221  NA 116*  K 3.7  CL 74*  CO2 31  GLUCOSE 131*  BUN 13  CREATININE 0.66  CALCIUM 8.6*  MG 1.7  PHOS 3.0   GFR: Estimated Creatinine Clearance: 81.6 mL/min (by C-G formula based on SCr of 0.66 mg/dL). Liver Function Tests: Recent Labs  Lab 02/22/18 2221  AST 93*  ALT 57*  ALKPHOS 40  BILITOT 0.9  PROT 6.7  ALBUMIN 3.3*   No results for input(s): LIPASE, AMYLASE in the last 168 hours. No results for input(s): AMMONIA in the last 168 hours. Coagulation Profile: No results for input(s): INR, PROTIME in the last 168 hours. Cardiac  Enzymes: Recent Labs  Lab 02/22/18 2221  TROPONINI <0.03   BNP (last 3 results) No results for input(s): PROBNP in the last 8760 hours. HbA1C: No results for input(s): HGBA1C in the last 72 hours. CBG: No results for input(s): GLUCAP in the last 168 hours. Lipid Profile: No results for input(s): CHOL, HDL, LDLCALC, TRIG, CHOLHDL, LDLDIRECT in the last 72 hours. Thyroid Function Tests: No results for input(s): TSH, T4TOTAL, FREET4, T3FREE, THYROIDAB in the last 72 hours. Anemia Panel: No results for input(s): VITAMINB12, FOLATE, FERRITIN, TIBC, IRON, RETICCTPCT in the last 72 hours. Urine analysis: No results found for: COLORURINE, APPEARANCEUR, LABSPEC, PHURINE, GLUCOSEU, HGBUR, BILIRUBINUR, KETONESUR, PROTEINUR, UROBILINOGEN, NITRITE, LEUKOCYTESUR  Radiological Exams on Admission: Dg Chest Portable 1 View  Result Date: 02/22/2018 CLINICAL DATA:  Cough and body aches for 5 days. Shortness of breath. EXAM: PORTABLE CHEST 1 VIEW COMPARISON:  11/11/2017 FINDINGS: Hazy right lung base opacity suspicious for pneumonia. Unchanged heart size and mediastinal contours. There is central bronchial thickening. No evidence of pleural effusion. No pneumothorax. IMPRESSION: Hazy right lung base opacity suspicious for pneumonia. Followup PA and lateral chest X-ray is recommended in 3-4 weeks following trial of antibiotic therapy to ensure resolution and exclude underlying malignancy. Electronically Signed   By: Rubye Oaks M.D.   On: 02/22/2018 21:56    EKG: Independently reviewed.  Vent. rate 65 BPM PR interval * ms QRS duration 100 ms QT/QTc 408/425 ms P-R-T axes 72 75 55 Sinus rhythm Baseline wander in V3.  Assessment/Plan Principal Problem:   CAP (community acquired pneumonia) Admit to stepdown/inpatient. Continue supplemental oxygen. Continue scheduled and as needed bronchodilators. Continue azithromycin 500 mg IVPB every 24 hours. Continue ceftriaxone 1 g IVPB every 24  hours. Check sputum Gram stain, culture and sensitivity. Check Legionella pneumophila and strep pneumoniae urinary antigens.  Active Problems:   Reactive airway disease with wheezing Continue supplemental oxygen and bronchodilators. Continue treatment for pneumonia.    Hyponatremia Likely secondary to hydrochlorothiazide. Hold HCTZ. Check urine and serum osmolality. Check urine sodium and potassium. Normal saline infusion. Free water intake restriction.  This was discussed with and explained to the patient.    Transaminitis No baseline measurements on record. The patient stated that 2-3 months ago she had imaging and hepatitis panel done at the hospital in [place], [place].  Apparently, she was told that she may have cirrhosis. Besides following up AST/ALT, we will try to obtain results and records from [facility]    Atypical lymphocytes present on peripheral blood smear In the presence of a viral syndrome, will check Monospot screening. She was seen for leukocytosis by hematology in 2014. No further action was taken other than monitoring. However, the patient has not seen hematology since then. Follow-up CBC with differential. Consider outpatient hematology re-evaluation.    Essential hypertension Monitor blood pressure. Hold hydrochlorothiazide due to hyponatremia. Continue metoprolol 50 mg p.o. daily. Monitor blood pressure, heart rate, renal function and electrolytes.   DVT prophylaxis: Lovenox SQ. Code Status: Full code. Family Communication: Her  Disposition Plan: Admit for IV antibiotic and sodium replacement for 2-3 days. Consults called:  Admission status: Inpatient/stepdown.   Bobette Mo MD [facility] Pager [phone].  If 7PM-7AM, please contact night-coverage [email or web address] Password [facility]  02/22/2018, 11:41 PM

## 2018-02-22 NOTE — ED Triage Notes (Addendum)
Patient complaining of cough and body aches x 5 days. Patient complaining of shortness of breath when lying flat. O2 saturation 78% on room air in triage. Increased to 85% with slow deep breathing.

## 2018-02-22 NOTE — ED Provider Notes (Signed)
Emergency Department Provider Note   I have reviewed the triage vital signs and the nursing notes.   HISTORY  Chief Complaint Cough   HPI Cynthia [name] is a 62 y.o. female With a history of hypertension and a long smoking history the presents to the emergency department today secondary to a cough.  Patient states she has had a cough for for 5 days has been progressively worsening.  It is productive sometimes and sometimes not.  Seems to be worse when she lies flat and when she moves around.  She feels that she just cannot get a deep breath.  She does not have any chest pain aside from when she is coughing.  She does have some upper abdominal pain but she states this is from coughing fits.  She has no back pain, nausea, vomiting, diarrhea, constipation, fever or sick contacts.  No other associated or modifying symptoms.    Past Medical History:  Diagnosis Date  . Elevated triglycerides with high cholesterol   . Hypertension   . S/P tooth extraction Feb 2014   for dentures    Patient Active Problem List   Diagnosis Date Noted  . CAP (community acquired pneumonia) 02/22/2018    Past Surgical History:  Procedure Laterality Date  . ABDOMINAL HYSTERECTOMY     2 partial    Current Outpatient Rx  . Order #: [id number] Class: Historical Med  . Order #: [id number] Class: Historical Med  . Order #: [id number] Class: Historical Med  . Order #: [id number] Class: Historical Med  . Order #: [id number] Class: Historical Med  . Order #: [id number] Class: Historical Med    Allergies Patient has no known allergies.  Family History  Problem Relation Age of Onset  . Diabetes Mother   . Stroke Maternal Grandmother     Social History Social History   Tobacco Use  . Smoking status: Current Every Day Smoker    Packs/day: 1.00    Years: 30.00    Pack years: 30.00    Types: Cigarettes  . Smokeless tobacco: Never Used  Substance Use Topics  . Alcohol use: No  . Drug use: No    Review of  Systems  All other systems negative except as documented in the HPI. All pertinent positives and negatives as reviewed in the HPI. ____________________________________________   PHYSICAL EXAM:  VITAL SIGNS: ED Triage Vitals  Enc Vitals Group     BP 02/22/18 2111 137/88     Pulse Rate 02/22/18 2111 67     Resp 02/22/18 2111 19     Temp 02/22/18 2111 98.5 F (36.9 C)     Temp Source 02/22/18 2111 Oral     SpO2 02/22/18 2111 (!) 78 %     Weight 02/22/18 2110 197 lb (89.4 kg)     Height 02/22/18 2110 5\' 5"  (1.651 m)    Constitutional: Alert and oriented. Well appearing and in no acute distress. Eyes: Conjunctivae are normal. PERRL. EOMI. Head: Atraumatic. Nose: No congestion/rhinnorhea. Mouth/Throat: Mucous membranes are moist.  Oropharynx non-erythematous. Neck: No stridor.  No meningeal signs.   Cardiovascular: Normal rate, regular rhythm. Good peripheral circulation. Grossly normal heart sounds.   Respiratory: tachypneic, hypoxic, mod distress. Lungs nearly silent bilaterally with high pitched wheezing.  Gastrointestinal: Soft and nontender. No distention.  Musculoskeletal: No lower extremity tenderness nor edema. No gross deformities of extremities. Neurologic:  Normal speech and language. No gross focal neurologic deficits are appreciated.  Skin:  Skin is warm, dry and intact. No rash noted.  ____________________________________________   LABS (all labs ordered are listed, but only abnormal results are displayed)  Labs Reviewed  COMPREHENSIVE METABOLIC PANEL - Abnormal; Notable for the following components:      Result Value   Sodium 116 (*)    Chloride 74 (*)    Glucose, Bld 131 (*)    Calcium 8.6 (*)    Albumin 3.3 (*)    AST 93 (*)    ALT 57 (*)    All other components within normal limits  RESPIRATORY PANEL BY PCR  CBC WITH DIFFERENTIAL/PLATELET  TROPONIN I  PHOSPHORUS  MAGNESIUM  OSMOLALITY  OSMOLALITY, URINE  NA AND K (SODIUM & POTASSIUM), RAND UR    URINALYSIS, ROUTINE W REFLEX MICROSCOPIC   ____________________________________________  EKG   EKG Interpretation  Date/Time:  Saturday February 22 2018 21:23:23 EDT Ventricular Rate:  65 PR Interval:    QRS Duration: 100 QT Interval:  408 QTC Calculation: 425 R Axis:   75 Text Interpretation:  Sinus rhythm Baseline wander in lead(s) V3 No significant change since last tracing in 2008 Confirmed by Marily Memos [phone]) on 02/22/2018 9:38:54 PM       ____________________________________________  RADIOLOGY  Dg Chest Portable 1 View  Result Date: 02/22/2018 CLINICAL DATA:  Cough and body aches for 5 days. Shortness of breath. EXAM: PORTABLE CHEST 1 VIEW COMPARISON:  11/11/2017 FINDINGS: Hazy right lung base opacity suspicious for pneumonia. Unchanged heart size and mediastinal contours. There is central bronchial thickening. No evidence of pleural effusion. No pneumothorax. IMPRESSION: Hazy right lung base opacity suspicious for pneumonia. Followup PA and lateral chest X-ray is recommended in 3-4 weeks following trial of antibiotic therapy to ensure resolution and exclude underlying malignancy. Electronically Signed   By: Rubye Oaks M.D.   On: 02/22/2018 21:56    ____________________________________________   PROCEDURES  Procedure(s) performed:   Procedures  CRITICAL CARE Performed by: Marily Memos Total critical care time: 35 minutes Critical care time was exclusive of separately billable procedures and treating other patients. Critical care was necessary to treat or prevent imminent or life-threatening deterioration. Critical care was time spent personally by me on the following activities: development of treatment plan with patient and/or surrogate as well as nursing, discussions with consultants, evaluation of patient's response to treatment, examination of patient, obtaining history from patient or surrogate, ordering and performing treatments and interventions,  ordering and review of laboratory studies, ordering and review of radiographic studies, pulse oximetry and re-evaluation of patient's condition.  ____________________________________________   INITIAL IMPRESSION / ASSESSMENT AND PLAN / ED COURSE  CAP vs severe copd exacerbation. Quite hypoxic on room air, with significantly diminished breath sounds. Will give continuous albuterol/mg/ipratropium/steroids. Reassess, but likely needs admission.   Patient with right lower lobe pneumonia on chest x-ray as interpreted by myself.  Also I reviewed her labs and she was also found to be hyponatremic. antibiotics aleady stasrted. fluids ordered. not septic. Will plan for admission.   Pertinent labs & imaging results that were available during my care of the patient were reviewed by me and considered in my medical decision making (see chart for details).  ____________________________________________  FINAL CLINICAL IMPRESSION(S) / ED DIAGNOSES  Final diagnoses:  Acute respiratory failure with hypoxia (HCC)  Cough  Community acquired pneumonia of right lower lobe of lung (HCC)  Hyponatremia     MEDICATIONS GIVEN DURING THIS VISIT:  Medications  albuterol (PROVENTIL,VENTOLIN) solution continuous neb (15 mg/hr Nebulization New Bag/Given 02/22/18 2144)  magnesium sulfate IVPB 2 g 50  mL (2 g Intravenous New Bag/Given 02/22/18 2225)  cefTRIAXone (ROCEPHIN) 1 g in sodium chloride 0.9 % 100 mL IVPB (not administered)  0.9 % NaCl with KCl 20 mEq/ L  infusion (not administered)  ipratropium (ATROVENT) nebulizer solution 1 mg (1 mg Nebulization Given 02/22/18 2144)  predniSONE (DELTASONE) tablet 60 mg (60 mg Oral Given 02/22/18 2155)  azithromycin (ZITHROMAX) tablet 500 mg (500 mg Oral Given 02/22/18 2155)     NEW OUTPATIENT MEDICATIONS STARTED DURING THIS VISIT:  New Prescriptions   No medications on file    Note:  This note was prepared with assistance of Dragon voice recognition software.  Occasional wrong-word or sound-a-like substitutions may have occurred due to the inherent limitations of voice recognition software.   Marily MemosMesner, Suzie Vandam, MD 02/22/18 [phone]

## 2018-02-22 NOTE — ED Notes (Signed)
Date and time results received: 02/22/18 11:27 PM Test: sodium Critical Value: 116  Name of Provider Notified: [email or web address]  Orders Received? Or Actions Taken?: md notified.

## 2018-02-23 ENCOUNTER — Encounter (HOSPITAL_COMMUNITY): Payer: Self-pay

## 2018-02-23 ENCOUNTER — Other Ambulatory Visit (HOSPITAL_COMMUNITY): Payer: Self-pay | Admitting: *Deleted

## 2018-02-23 ENCOUNTER — Other Ambulatory Visit: Payer: Self-pay

## 2018-02-23 ENCOUNTER — Inpatient Hospital Stay (HOSPITAL_COMMUNITY): Payer: Federal, State, Local not specified - PPO

## 2018-02-23 DIAGNOSIS — R888 Abnormal findings in other body fluids and substances: Secondary | ICD-10-CM | POA: Diagnosis not present

## 2018-02-23 DIAGNOSIS — E871 Hypo-osmolality and hyponatremia: Secondary | ICD-10-CM

## 2018-02-23 DIAGNOSIS — R740 Nonspecific elevation of levels of transaminase and lactic acid dehydrogenase [LDH]: Secondary | ICD-10-CM

## 2018-02-23 DIAGNOSIS — R0609 Other forms of dyspnea: Secondary | ICD-10-CM | POA: Diagnosis not present

## 2018-02-23 DIAGNOSIS — J181 Lobar pneumonia, unspecified organism: Secondary | ICD-10-CM | POA: Diagnosis not present

## 2018-02-23 DIAGNOSIS — I10 Essential (primary) hypertension: Secondary | ICD-10-CM

## 2018-02-23 LAB — RESPIRATORY PANEL BY PCR
ADENOVIRUS-RVPPCR: DETECTED — AB
Bordetella pertussis: NOT DETECTED
CORONAVIRUS NL63-RVPPCR: NOT DETECTED
CORONAVIRUS OC43-RVPPCR: NOT DETECTED
Chlamydophila pneumoniae: NOT DETECTED
Coronavirus 229E: NOT DETECTED
Coronavirus HKU1: NOT DETECTED
INFLUENZA A-RVPPCR: NOT DETECTED
Influenza B: NOT DETECTED
Metapneumovirus: NOT DETECTED
Mycoplasma pneumoniae: NOT DETECTED
PARAINFLUENZA VIRUS 1-RVPPCR: NOT DETECTED
PARAINFLUENZA VIRUS 2-RVPPCR: NOT DETECTED
PARAINFLUENZA VIRUS 3-RVPPCR: NOT DETECTED
PARAINFLUENZA VIRUS 4-RVPPCR: NOT DETECTED
RHINOVIRUS / ENTEROVIRUS - RVPPCR: NOT DETECTED
Respiratory Syncytial Virus: NOT DETECTED

## 2018-02-23 LAB — URINALYSIS, ROUTINE W REFLEX MICROSCOPIC
Bilirubin Urine: NEGATIVE
Glucose, UA: NEGATIVE mg/dL
Ketones, ur: NEGATIVE mg/dL
Leukocytes, UA: NEGATIVE
Nitrite: POSITIVE — AB
Protein, ur: NEGATIVE mg/dL
SPECIFIC GRAVITY, URINE: 1.008 (ref 1.005–1.030)
pH: 6 (ref 5.0–8.0)

## 2018-02-23 LAB — CBC WITH DIFFERENTIAL/PLATELET
BASOS ABS: 0 10*3/uL (ref 0.0–0.1)
BASOS PCT: 0 %
EOS ABS: 0 10*3/uL (ref 0.0–0.7)
Eosinophils Relative: 0 %
HCT: 39.3 % (ref 36.0–46.0)
Hemoglobin: 13.7 g/dL (ref 12.0–15.0)
LYMPHS PCT: 11 %
Lymphs Abs: 0.8 10*3/uL (ref 0.7–4.0)
MCH: 29.8 pg (ref 26.0–34.0)
MCHC: 34.9 g/dL (ref 30.0–36.0)
MCV: 85.6 fL (ref 78.0–100.0)
MONO ABS: 0.2 10*3/uL (ref 0.1–1.0)
Monocytes Relative: 3 %
NEUTROS ABS: 5.9 10*3/uL (ref 1.7–7.7)
NEUTROS PCT: 86 %
PLATELETS: 167 10*3/uL (ref 150–400)
RBC: 4.59 MIL/uL (ref 3.87–5.11)
RDW: 12.2 % (ref 11.5–15.5)
WBC: 6.9 10*3/uL (ref 4.0–10.5)

## 2018-02-23 LAB — ECHOCARDIOGRAM COMPLETE
HEIGHTINCHES: 65 in
Weight: 3152 oz

## 2018-02-23 LAB — COMPREHENSIVE METABOLIC PANEL
ALBUMIN: 3.4 g/dL — AB (ref 3.5–5.0)
ALT: 52 U/L (ref 14–54)
ANION GAP: 12 (ref 5–15)
AST: 73 U/L — ABNORMAL HIGH (ref 15–41)
Alkaline Phosphatase: 38 U/L (ref 38–126)
BUN: 13 mg/dL (ref 6–20)
CHLORIDE: 77 mmol/L — AB (ref 101–111)
CO2: 27 mmol/L (ref 22–32)
Calcium: 8.4 mg/dL — ABNORMAL LOW (ref 8.9–10.3)
Creatinine, Ser: 0.61 mg/dL (ref 0.44–1.00)
GFR calc non Af Amer: >60 mL/min (ref >60)
GLUCOSE: 150 mg/dL — AB (ref 65–99)
Potassium: 3.9 mmol/L (ref 3.5–5.1)
SODIUM: 116 mmol/L — AB (ref 135–145)
Total Bilirubin: 0.8 mg/dL (ref 0.3–1.2)
Total Protein: 6.7 g/dL (ref 6.5–8.1)

## 2018-02-23 LAB — OSMOLALITY, URINE: Osmolality, Ur: 260 mOsm/kg — ABNORMAL LOW (ref 300–900)

## 2018-02-23 LAB — BRAIN NATRIURETIC PEPTIDE: B Natriuretic Peptide: 62 pg/mL (ref 0.0–100.0)

## 2018-02-23 LAB — NA AND K (SODIUM & POTASSIUM), RAND UR
Potassium Urine: 14 mmol/L
Sodium, Ur: <10 mmol/L

## 2018-02-23 LAB — OSMOLALITY: OSMOLALITY: 249 mosm/kg — AB (ref 275–295)

## 2018-02-23 LAB — MONONUCLEOSIS SCREEN: Mono Screen: NEGATIVE

## 2018-02-23 LAB — STREP PNEUMONIAE URINARY ANTIGEN: STREP PNEUMO URINARY ANTIGEN: NEGATIVE

## 2018-02-23 MED ORDER — FENOFIBRATE 160 MG PO TABS
160.0000 mg | ORAL_TABLET | Freq: Every day | ORAL | Status: DC
  Administered 2018-02-23 – 2018-02-24 (×2): 160 mg via ORAL
  Filled 2018-02-23 (×3): qty 1

## 2018-02-23 MED ORDER — ORAL CARE MOUTH RINSE
15.0000 mL | Freq: Two times a day (BID) | OROMUCOSAL | Status: DC
  Administered 2018-02-23: 15 mL via OROMUCOSAL

## 2018-02-23 MED ORDER — METHYLPREDNISOLONE SODIUM SUCC 40 MG IJ SOLR
40.0000 mg | Freq: Two times a day (BID) | INTRAMUSCULAR | Status: DC
  Administered 2018-02-24: 40 mg via INTRAVENOUS
  Filled 2018-02-23: qty 1

## 2018-02-23 MED ORDER — BENZONATATE 100 MG PO CAPS
200.0000 mg | ORAL_CAPSULE | Freq: Three times a day (TID) | ORAL | Status: DC | PRN
  Administered 2018-02-23 (×2): 200 mg via ORAL
  Filled 2018-02-23 (×2): qty 2

## 2018-02-23 MED ORDER — SODIUM CHLORIDE 0.9 % IV SOLN
INTRAVENOUS | Status: DC
  Administered 2018-02-23: 05:00:00 via INTRAVENOUS

## 2018-02-23 MED ORDER — ASPIRIN EC 81 MG PO TBEC
81.0000 mg | DELAYED_RELEASE_TABLET | Freq: Every day | ORAL | Status: DC
  Administered 2018-02-23 – 2018-02-24 (×2): 81 mg via ORAL
  Filled 2018-02-23 (×2): qty 1

## 2018-02-23 MED ORDER — IPRATROPIUM-ALBUTEROL 0.5-2.5 (3) MG/3ML IN SOLN
3.0000 mL | Freq: Four times a day (QID) | RESPIRATORY_TRACT | Status: DC
  Administered 2018-02-23: 3 mL via RESPIRATORY_TRACT
  Filled 2018-02-23: qty 3

## 2018-02-23 MED ORDER — PROMETHAZINE HCL 25 MG/ML IJ SOLN: 12.5000 mg | INTRAMUSCULAR | Status: DC | PRN

## 2018-02-23 MED ORDER — KETOROLAC TROMETHAMINE 30 MG/ML IJ SOLN
30.0000 mg | Freq: Once | INTRAMUSCULAR | Status: AC
  Administered 2018-02-23: 30 mg via INTRAVENOUS
  Filled 2018-02-23: qty 1

## 2018-02-23 MED ORDER — PNEUMOCOCCAL VAC POLYVALENT 25 MCG/0.5ML IJ INJ
0.5000 mL | INJECTION | INTRAMUSCULAR | Status: AC
  Administered 2018-02-24: 0.5 mL via INTRAMUSCULAR
  Filled 2018-02-23: qty 0.5

## 2018-02-23 MED ORDER — METHYLPREDNISOLONE SODIUM SUCC 40 MG IJ SOLR
40.0000 mg | Freq: Four times a day (QID) | INTRAMUSCULAR | Status: DC
  Administered 2018-02-23 (×3): 40 mg via INTRAVENOUS
  Filled 2018-02-23 (×3): qty 1

## 2018-02-23 MED ORDER — METOPROLOL SUCCINATE ER 50 MG PO TB24
50.0000 mg | ORAL_TABLET | Freq: Every day | ORAL | Status: DC
  Administered 2018-02-23 – 2018-02-24 (×2): 50 mg via ORAL
  Filled 2018-02-23 (×2): qty 1

## 2018-02-23 MED ORDER — IPRATROPIUM-ALBUTEROL 0.5-2.5 (3) MG/3ML IN SOLN
3.0000 mL | Freq: Three times a day (TID) | RESPIRATORY_TRACT | Status: DC
  Administered 2018-02-23 – 2018-02-24 (×3): 3 mL via RESPIRATORY_TRACT
  Filled 2018-02-23 (×3): qty 3

## 2018-02-23 MED ORDER — NICOTINE 14 MG/24HR TD PT24: 14.0000 mg | MEDICATED_PATCH | Freq: Every day | TRANSDERMAL | Status: DC | PRN

## 2018-02-23 MED ORDER — SALINE SPRAY 0.65 % NA SOLN
1.0000 | NASAL | Status: DC | PRN
  Administered 2018-02-23: 1 via NASAL
  Filled 2018-02-23: qty 44

## 2018-02-23 NOTE — ED Notes (Signed)
Pt given meal tray.

## 2018-02-23 NOTE — Progress Notes (Signed)
*  PRELIMINARY RESULTS* Echocardiogram 2D Echocardiogram has been performed.  Stacey DrainWhite, Kameela Leipold J 02/23/2018, 10:50 AM

## 2018-02-23 NOTE — Progress Notes (Signed)
PROGRESS NOTE  Cynthia Poissonamela S Kilner  ZOX:[id number]  DOB: 03/24/1956  DOA: 02/22/2018 PCP: Shawnie DapperMann, Benjamin L, PA-C  Brief Admission Hx: Cynthia [name] is a 62 y.o. female with medical history significant of hyperlipidemia, hypertension, tobacco use who is coming to the emergency department with complaints of orthopnea, cough and body aches for 5 days.  She was admitted with pneumonia and also metabolic abnormalities as she was found to be hyponatremic with Na of 116.    MDM/Assessment & Plan:   1. Community acquired pneumonia-admitted for IV antibiotics with ceftriaxone and azithromycin.  Follow cultures and follow Legionella and strep pneumo urinary antigens. 2. Hyponatremia suspect this is secondary to hydrochlorothiazide which has been held.  Treating with free water restriction and normal saline infusion.  Will follow BMP closely. 3. Mild transaminitis-patient says that she was followed up for this when [facility] [place] [facility].  Records have been requested. 4. Atypical lymphocytes-this was seen on peripheral blood smear.  Monospot testing has been obtained.  The patient should follow-up with outpatient hematology for reevaluation. 5. Essential hypertension-we will monitor blood pressure in the setting of holding hydrochlorothiazide secondary to hyponatremia.  Continue metoprolol 50 mg daily.   DVT prophylaxis: Lovenox Code Status: Full code Family Communication: No other family members present but patient updated at bedside Disposition Plan: Ongoing medical care needed and electrolyte abnormalities need to be improving and corrected  Subjective: The patient is without complaints this morning.  She denies chest pain and shortness of breath and does not seem confused.  Objective: Vitals:   02/23/18 0300 02/23/18 0708 02/23/18 0947 02/23/18 0958  BP: 112/69 (!) 114/93 132/73   Pulse: 69 (!) 59 70   Resp: 17 16    Temp:      TempSrc:      SpO2: 93% 92%  92% 94%  Weight:      Height:       No intake or output data in the 24 hours ending 02/23/18 1032 Filed Weights   02/22/18 2110  Weight: 89.4 kg (197 lb)     REVIEW OF SYSTEMS  As per history otherwise all reviewed and reported negative  Exam:  General exam: Awake, alert, no distress, dry mucous membranes. Respiratory system: Bibasilar wheezes heard. No increased work of breathing. Cardiovascular system: S1 & S2 heard, RRR. No JVD, murmurs, gallops, clicks or pedal edema. Gastrointestinal system: Abdomen is nondistended, soft and nontender. Normal bowel sounds heard. Central nervous system: Alert and oriented. No focal neurological deficits. Extremities: no CCE.  Data Reviewed: Basic Metabolic Panel: Recent Labs  Lab 02/22/18 2221  NA 116*  K 3.7  CL 74*  CO2 31  GLUCOSE 131*  BUN 13  CREATININE 0.66  CALCIUM 8.6*  MG 1.7  PHOS 3.0   Liver Function Tests: Recent Labs  Lab 02/22/18 2221  AST 93*  ALT 57*  ALKPHOS 40  BILITOT 0.9  PROT 6.7  ALBUMIN 3.3*   No results for input(s): LIPASE, AMYLASE in the last 168 hours. No results for input(s): AMMONIA in the last 168 hours. CBC: Recent Labs  Lab 02/22/18 2221 02/23/18 0917  WBC 6.4 6.9  NEUTROABS 4.2 5.9  HGB 13.9 13.7  HCT 39.9 39.3  MCV 86.4 85.6  PLT 154 167   Cardiac Enzymes: Recent Labs  Lab 02/22/18 2221  TROPONINI <0.03   CBG (last 3)  No results for input(s): GLUCAP in the last 72 hours. No results found for this or  any previous visit (from the past 240 hour(s)).   Studies: Dg Chest Portable 1 View  Result Date: 02/22/2018 CLINICAL DATA:  Cough and body aches for 5 days. Shortness of breath. EXAM: PORTABLE CHEST 1 VIEW COMPARISON:  11/11/2017 FINDINGS: Hazy right lung base opacity suspicious for pneumonia. Unchanged heart size and mediastinal contours. There is central bronchial thickening. No evidence of pleural effusion. No pneumothorax. IMPRESSION: Hazy right lung base opacity  suspicious for pneumonia. Followup PA and lateral chest X-ray is recommended in 3-4 weeks following trial of antibiotic therapy to ensure resolution and exclude underlying malignancy. Electronically Signed   By: Rubye Oaks M.D.   On: 02/22/2018 21:56   Scheduled Meds: . aspirin EC  81 mg Oral Daily  . enoxaparin (LOVENOX) injection  40 mg Subcutaneous Q24H  . fenofibrate  160 mg Oral Daily  . ipratropium-albuterol  3 mL Nebulization Q6H  . methylPREDNISolone (SOLU-MEDROL) injection  40 mg Intravenous Q6H  . [START ON 02/24/2018] metoprolol succinate  50 mg Oral Q breakfast   Continuous Infusions: . sodium chloride 75 mL/hr at 02/23/18 0453  . azithromycin    . cefTRIAXone (ROCEPHIN)  IV     Principal Problem:   CAP (community acquired pneumonia) Active Problems:   Hyponatremia   Essential hypertension   Transaminitis   Reactive airway disease with wheezing   Atypical lymphocytes present on peripheral blood smear   Essential hypertension  Time spent:   Standley Dakins, MD, FAAFP [facility] Pager [phone]  If 7PM-7AM, please contact night-coverage [email or web address] Password [email or web address] 02/23/2018, 10:32 AM    LOS: 1 day

## 2018-02-23 NOTE — ED Notes (Signed)
Pt ambulatory to bathroom with oxygen.  sats in the upper 80's.  Pt no visible distress.  No complaints.

## 2018-02-23 NOTE — ED Notes (Signed)
Pt sitting on side of bed.  No distress.  States she is breathing fine.

## 2018-02-24 DIAGNOSIS — Z720 Tobacco use: Secondary | ICD-10-CM

## 2018-02-24 DIAGNOSIS — J181 Lobar pneumonia, unspecified organism: Secondary | ICD-10-CM | POA: Diagnosis not present

## 2018-02-24 DIAGNOSIS — J189 Pneumonia, unspecified organism: Secondary | ICD-10-CM | POA: Diagnosis not present

## 2018-02-24 DIAGNOSIS — R888 Abnormal findings in other body fluids and substances: Secondary | ICD-10-CM | POA: Diagnosis not present

## 2018-02-24 DIAGNOSIS — J441 Chronic obstructive pulmonary disease with (acute) exacerbation: Secondary | ICD-10-CM

## 2018-02-24 DIAGNOSIS — E871 Hypo-osmolality and hyponatremia: Secondary | ICD-10-CM | POA: Diagnosis not present

## 2018-02-24 DIAGNOSIS — I10 Essential (primary) hypertension: Secondary | ICD-10-CM | POA: Diagnosis not present

## 2018-02-24 LAB — BASIC METABOLIC PANEL
Anion gap: 8 (ref 5–15)
BUN: 12 mg/dL (ref 6–20)
CO2: 30 mmol/L (ref 22–32)
Calcium: 8.3 mg/dL — ABNORMAL LOW (ref 8.9–10.3)
Chloride: 86 mmol/L — ABNORMAL LOW (ref 101–111)
Creatinine, Ser: 0.58 mg/dL (ref 0.44–1.00)
GFR calc Af Amer: >60 mL/min (ref >60)
Glucose, Bld: 119 mg/dL — ABNORMAL HIGH (ref 65–99)
POTASSIUM: 4.4 mmol/L (ref 3.5–5.1)
SODIUM: 124 mmol/L — AB (ref 135–145)

## 2018-02-24 LAB — LEGIONELLA PNEUMOPHILA SEROGP 1 UR AG: L. pneumophila Serogp 1 Ur Ag: NEGATIVE

## 2018-02-24 MED ORDER — PREDNISONE 20 MG PO TABS: ORAL_TABLET | ORAL | 0 refills | Status: DC

## 2018-02-24 MED ORDER — IPRATROPIUM-ALBUTEROL 0.5-2.5 (3) MG/3ML IN SOLN: 3.0000 mL | RESPIRATORY_TRACT | 1 refills | Status: DC | PRN

## 2018-02-24 MED ORDER — DOXYCYCLINE MONOHYDRATE 100 MG PO TABS: 100.0000 mg | ORAL_TABLET | Freq: Two times a day (BID) | ORAL | 0 refills | Status: AC

## 2018-02-24 NOTE — Progress Notes (Signed)
Patient took partial neb due to coughing with chest pain.

## 2018-02-24 NOTE — Care Management Note (Addendum)
Case Management Note  Patient Details  Name: Cynthia [name] MRN: [id number] Date of Birth: 11/04/1956 Subjective/Objective:        Admitted with pneumonia. Pt from home, ind with ADL'S. Has PCP, transportation (drives self) and insurance with drug coverage.             Action/Plan: DC home today with self care, needs home O2 and neb machine. Pt has chosen [facility] from list of DME providers[facility] aware of referral and will pull pt info from chart and deliver DME to pt room prior to DC.    Expected Discharge Date:         02/24/18         Expected Discharge Plan:  Home/Self Care  In-House Referral:  NA  Discharge planning Services  CM Consult  Post Acute Care Choice:  Durable Medical Equipment Choice offered to:  Patient  DME Arranged:  Oxygen, neb machine DME Agency:  [facility]  Status of Service:  Completed, signed off  Malcolm MetroChildress, Karsten Howry Demske, RN 02/24/2018, 2:02 PM

## 2018-02-24 NOTE — Progress Notes (Addendum)
SATURATION QUALIFICATIONS: (This note is used to comply with regulatory documentation for home oxygen)  Patient Saturations on Room Air at Rest = 74%  Patient Saturations on Room Air while Ambulating = unable to ambulate  Patient Saturations on 2L while at rest = 93%  Please briefly explain why patient needs home oxygen: pt desats on room air while at rest

## 2018-02-24 NOTE — Progress Notes (Signed)
Cynthia PoissonPamela S [name] discharged Home per MD order.  Discharge instructions reviewed and discussed with the patient, all questions and concerns answered. Copy of instructions and scripts given to patient.  Allergies as of 02/24/2018   No Known Allergies     Medication List    TAKE these medications   aspirin EC 81 MG tablet Take 81 mg by mouth daily.   doxycycline 100 MG tablet Commonly known as:  ADOXA Take 1 tablet (100 mg total) by mouth 2 (two) times daily for 5 days.   fenofibrate 160 MG tablet Take 160 mg by mouth daily.   ipratropium-albuterol 0.5-2.5 (3) MG/3ML Soln Commonly known as:  DUONEB Take 3 mLs by nebulization every 4 (four) hours as needed (wheezing, coughing, SOB).   metoprolol succinate 50 MG 24 hr tablet Commonly known as:  TOPROL-XL Take 50 mg by mouth daily. Take with or immediately following a meal.   predniSONE 20 MG tablet Commonly known as:  DELTASONE Take 3 PO QAM x3days, 2 PO QAM x3days, 1 PO QAM x3days            Durable Medical Equipment  (From admission, onward)        Start     Ordered   02/24/18 1447  For home use only DME Nebulizer machine  Once    Question Answer Comment  Patient needs a nebulizer to treat with the following condition COPD (chronic obstructive pulmonary disease) (HCC)   Patient needs a nebulizer to treat with the following condition Chronic bronchitis (HCC)   Patient needs a nebulizer to treat with the following condition Chronic bronchitis with acute exacerbation (HCC)      02/24/18 1446   02/24/18 1130  For home use only DME oxygen  Once    Question Answer Comment  Mode or (Route) Nasal cannula   Liters per Minute 2   Frequency Continuous (stationary and portable oxygen unit needed)   Oxygen conserving device Yes   Oxygen delivery system Gas      02/24/18 1129      Patients skin is clean, dry and intact, no evidence of skin break down. IV site discontinued and catheter remains intact. Site without signs and  symptoms of complications. Dressing and pressure applied.  Patient escorted to car by NT in a wheelchair,  no distress noted upon discharge.  Cynthia KoyanagiBonnie M Amaris [name] 02/24/2018 6:28 PM

## 2018-02-24 NOTE — Discharge Instructions (Signed)
STOP TAKING HYDROCHLOROTHIAZIDE   Follow with Primary MD  Shawnie Dapper, PA-C  and other consultant's as instructed your Hospitalist MD  Please get a complete blood count and chemistry panel checked by your Primary MD at your next visit, and again as instructed by your Primary MD.  Get Medicines reviewed and adjusted: Please take all your medications with you for your next visit with your Primary MD  Laboratory/radiological data: Please request your Primary MD to go over all hospital tests and procedure/radiological results at the follow up, please ask your Primary MD to get all Hospital records sent to his/her office.  In some cases, they will be blood work, cultures and biopsy results pending at the time of your discharge. Please request that your primary care M.D. follows up on these results.  Also Note the following: If you experience worsening of your admission symptoms, develop shortness of breath, life threatening emergency, suicidal or homicidal thoughts you must seek medical attention immediately by calling 911 or calling your MD immediately  if symptoms less severe.  You must read complete instructions/literature along with all the possible adverse reactions/side effects for all the Medicines you take and that have been prescribed to you. Take any new Medicines after you have completely understood and accpet all the possible adverse reactions/side effects.   Do not drive when taking Pain medications or sleeping medications (Benzodaizepines)  Do not take more than prescribed Pain, Sleep and Anxiety Medications. It is not advisable to combine anxiety,sleep and pain medications without talking with your primary care practitioner  Special Instructions: If you have smoked or chewed Tobacco  in the last 2 yrs please stop smoking, stop any regular Alcohol  and or any Recreational drug use.  Wear Seat belts while driving.  Please note: You were cared for by a hospitalist during your  hospital stay. Once you are discharged, your primary care physician will handle any further medical issues. Please note that NO REFILLS for any discharge medications will be authorized once you are discharged, as it is imperative that you return to your primary care physician (or establish a relationship with a primary care physician if you do not have one) for your post hospital discharge needs so that they can reassess your need for medications and monitor your lab values.      Community-Acquired Pneumonia, Adult Pneumonia is an infection of the lungs. There are different types of pneumonia. One type can develop while a person is in a hospital. A different type, called community-acquired pneumonia, develops in people who are not, or have not recently been, in the hospital or other health care facility. What are the causes? Pneumonia may be caused by bacteria, viruses, or funguses. Community-acquired pneumonia is often caused by Streptococcus pneumonia bacteria. These bacteria are often passed from one person to another by breathing in droplets from the cough or sneeze of an infected person. What increases the risk? The condition is more likely to develop in:  People who havechronic diseases, such as chronic obstructive pulmonary disease (COPD), asthma, congestive heart failure, cystic fibrosis, diabetes, or kidney disease.  People who haveearly-stage or late-stage HIV.  People who havesickle cell disease.  People who havehad their spleen removed (splenectomy).  People who havepoor Administrator.  People who havemedical conditions that increase the risk of breathing in (aspirating) secretions their own mouth and nose.  People who havea weakened immune system (immunocompromised).  People who smoke.  People whotravel to areas where pneumonia-causing germs commonly exist.  People whoare around animal habitats or animals that have pneumonia-causing germs, including birds, bats,  rabbits, cats, and farm animals.  What are the signs or symptoms? Symptoms of this condition include:  Adry cough.  A wet (productive) cough.  Fever.  Sweating.  Chest pain, especially when breathing deeply or coughing.  Rapid breathing or difficulty breathing.  Shortness of breath.  Shaking chills.  Fatigue.  Muscle aches.  How is this diagnosed? Your health care provider will take a medical history and perform a physical exam. You may also have other tests, including:  Imaging studies of your chest, including X-rays.  Tests to check your blood oxygen level and other blood gases.  Other tests on blood, mucus (sputum), fluid around your lungs (pleural fluid), and urine.  If your pneumonia is severe, other tests may be done to identify the specific cause of your illness. How is this treated? The type of treatment that you receive depends on many factors, such as the cause of your pneumonia, the medicines you take, and other medical conditions that you have. For most adults, treatment and recovery from pneumonia may occur at home. In some cases, treatment must happen in a hospital. Treatment may include:  Antibiotic medicines, if the pneumonia was caused by bacteria.  Antiviral medicines, if the pneumonia was caused by a virus.  Medicines that are given by mouth or through an IV tube.  Oxygen.  Respiratory therapy.  Although rare, treating severe pneumonia may include:  Mechanical ventilation. This is done if you are not breathing well on your own and you cannot maintain a safe blood oxygen level.  Thoracentesis. This procedureremoves fluid around one lung or both lungs to help you breathe better.  Follow these instructions at home:  Take over-the-counter and prescription medicines only as told by your health care provider. ? Only takecough medicine if you are losing sleep. Understand that cough medicine can prevent your bodys natural ability to remove mucus  from your lungs. ? If you were prescribed an antibiotic medicine, take it as told by your health care provider. Do not stop taking the antibiotic even if you start to feel better.  Sleep in a semi-upright position at night. Try sleeping in a reclining chair, or place a few pillows under your head.  Do not use tobacco products, including cigarettes, chewing tobacco, and e-cigarettes. If you need help quitting, ask your health care provider.  Drink enough water to keep your urine clear or pale yellow. This will help to thin out mucus secretions in your lungs. How is this prevented? There are ways that you can decrease your risk of developing community-acquired pneumonia. Consider getting a pneumococcal vaccine if:  You are older than 62 years of age.  You are older than 62 years of age and are undergoing cancer treatment, have chronic lung disease, or have other medical conditions that affect your immune system. Ask your health care provider if this applies to you.  There are different types and schedules of pneumococcal vaccines. Ask your health care provider which vaccination option is best for you. You may also prevent community-acquired pneumonia if you take these actions:  Get an influenza vaccine every year. Ask your health care provider which type of influenza vaccine is best for you.  Go to the dentist on a regular basis.  Wash your hands often. Use hand sanitizer if soap and water are not available.  Contact a health care provider if:  You have a fever.  You are  losing sleep because you cannot control your cough with cough medicine. Get help right away if:  You have worsening shortness of breath.  You have increased chest pain.  Your sickness becomes worse, especially if you are an older adult or have a weakened immune system.  You cough up blood. This information is not intended to replace advice given to you by your health care provider. Make sure you discuss any  questions you have with your health care provider. Document Released: 11/26/2005 Document Revised: 04/05/2016 Document Reviewed: 03/23/2015 Elsevier Interactive Patient Education  2018 [employer].   Hyponatremia Hyponatremia is when the amount of salt (sodium) in your blood is too low. When salt levels are low, your cells absorb extra water and they swell. The swelling happens throughout the body, but it mostly affects the brain. Follow these instructions at home:  Take medicines only as told by your doctor. Many medicines can make this condition worse. Talk with your doctor about any medicines that you are currently taking.  Carefully follow a recommended diet as told by your doctor.  Carefully follow instructions from your doctor about fluid restrictions.  Keep all follow-up visits as told by your doctor. This is important.  Do not drink alcohol. Contact a doctor if:  You feel sicker to your stomach (nauseous).  You feel more confused.  You feel more tired (fatigued).  Your headache gets worse.  You feel weaker.  Your symptoms go away and then they come back.  You have trouble following the diet instructions. Get help right away if:  You start to twitch and shake (have a seizure).  You pass out (faint).  You keep having watery poop (diarrhea).  You keep throwing up (vomiting). This information is not intended to replace advice given to you by your health care provider. Make sure you discuss any questions you have with your health care provider. Document Released: 08/08/2011 Document Revised: 05/03/2016 Document Reviewed: 11/22/2014 Elsevier Interactive Patient Education  [employer].

## 2018-02-24 NOTE — Discharge Summary (Signed)
Physician Discharge Summary  Cynthia [name] ZOX:[id number] DOB: 15-Aug-1956 DOA: 02/22/2018  PCP: Shawnie Dapper, PA-C  Admit date: 02/22/2018 Discharge date: 02/24/2018  Admitted From:  Home  Disposition: Home  Recommendations for Outpatient Follow-up:  1. Follow up with PCP in 1 weeks for recheck and labs 2. Please obtain BMP/CBC in one week 3. Please follow up on the following pending results: Final Culture Data  Discharge Condition: STABLE   CODE STATUS: FULL    Brief Hospitalization Summary: Please see all hospital notes, images, labs for full details of the hospitalization.  HPI: Cynthia [name] is a 62 y.o. female with medical history significant of hyperlipidemia, hypertension, tobacco use who is coming to the emergency department with complaints of orthopnea, cough and body aches for 5 days.  O2 sat was 78% initially in triage.  Per patient, for the past 5-6 days she has been having dyspnea, cough with productive whitish, occasionally yellowish brown sputum, fatigue, malaise, decreased appetite, pleuritic left lower lateral chest wall and body aches.  She denies travel history or known sick contacts.  She denies fever, chills, night sweats or hemoptysis.  No typical chest pain, palpitations, dizziness, diaphoresis, PND, but complains of recent lower extremity edema and orthopnea.  No abdominal pain, nausea, emesis, constipation, melena or hematochezia.  She states that she had one episode of loose stools several days ago, but none since then.  She denies dysuria, frequency or hematuria.  No polyphagia, polydipsia, polyuria, heat or cold intolerance.  She denies skin rashes or pruritus.  ED Course: Initial vital signs temperature 98.37F, pulse 67, respirations 19, blood pressure 137/88 mmHg and O2 sat 78% on room air.  The patient received supplemental oxygen, magnesium sulfate 2 g IVPB, several neb treatments totaling 15 mg of albuterol and 1.5 mg of ipratropium, ceftriaxone 1 g  IVPB and azithromycin 500 mg IVPB.  EKG was sinus rhythm at baseline wander in lead V3.Troponin was normal.  Her CBC values were normal, except for WBC morphology which showed atypical lymphocytes.  Sodium 116, potassium 3.7, chloride 74 and CO2 31 millimolar/L.  Her glucose is 131, BUN 13, creatinine 0.66, calcium 8.6, magnesium 1.7 and phosphorus 3.0 mg/dL.  Albumin was 3.3 g/dL.  AST was 93 and ALT 57 U/L.  The rest of the liver function tests were normal.  Imaging: Her chest radiograph shows a hazy right lung base opacity suspicious for pneumonia.  Please see images and full radiology report for further detail.  Follow-up imaging is recommended to be done in 3-4 weeks after antibiotic therapy is given.  Brief Admission Hx: Cynthia [name] a 62 y.o.femalewith medical history significant of hyperlipidemia, hypertension, tobacco use who is coming to the emergency department with complaints of orthopnea, cough and body aches for 5 days.  She was admitted with pneumonia and also metabolic abnormalities as she was found to be hyponatremic with Na of 116.    MDM/Assessment & Plan:   1. Community acquired pneumonia-admitted for IV antibiotics with ceftriaxone and azithromycin. Pt feeling a lot better and wanting to go home.  Will discharge on oral doxycycline x 5 more days.  2. Hyponatremia suspect this is secondary to hydrochlorothiazide which has been discontinued.  Treating with free water restriction and normal saline infusion.  Her sodium has improved to 124.  She is feeling better and wants to go home.  I have recommended discontinuing the HCTZ.   3. Mild transaminitis-patient says that she was followed up for this when she was  hospitalized in [facility] 3 months ago.  Records have been requested.  Follow up with PCP.   4. Atypical lymphocytes-this was seen on peripheral blood smear.  Monospot testing has been obtained.  The patient should follow-up with outpatient hematology for  reevaluation. 5. Essential hypertension-  Continue metoprolol 50 mg daily.  DC HCTZ   DVT prophylaxis: Lovenox Code Status: Full code Family Communication: No other family members present but patient updated at bedside Disposition Plan: home   Discharge Diagnoses:  Principal Problem:   CAP (community acquired pneumonia) Active Problems:   Hyponatremia   Essential hypertension   Transaminitis   Reactive airway disease with wheezing   Atypical lymphocytes present on peripheral blood smear   Essential hypertension   Tobacco use   Obstructive chronic bronchitis with exacerbation [facility])   Discharge Instructions: Discharge Instructions    Call MD for:  difficulty breathing, headache or visual disturbances   Complete by:  As directed    Call MD for:  extreme fatigue   Complete by:  As directed    Call MD for:  persistant dizziness or light-headedness   Complete by:  As directed    Call MD for:  persistant nausea and vomiting   Complete by:  As directed    Diet - low sodium heart healthy   Complete by:  As directed    Increase activity slowly   Complete by:  As directed      Allergies as of 02/24/2018   No Known Allergies     Medication List    TAKE these medications   aspirin EC 81 MG tablet Take 81 mg by mouth daily.   doxycycline 100 MG tablet Commonly known as:  ADOXA Take 1 tablet (100 mg total) by mouth 2 (two) times daily for 5 days.   fenofibrate 160 MG tablet Take 160 mg by mouth daily.   ipratropium-albuterol 0.5-2.5 (3) MG/3ML Soln Commonly known as:  DUONEB Take 3 mLs by nebulization every 4 (four) hours as needed (wheezing, coughing, SOB).   metoprolol succinate 50 MG 24 hr tablet Commonly known as:  TOPROL-XL Take 50 mg by mouth daily. Take with or immediately following a meal.   predniSONE 20 MG tablet Commonly known as:  DELTASONE Take 3 PO QAM x3days, 2 PO QAM x3days, 1 PO QAM x3days            Durable Medical Equipment  (From admission,  onward)        Start     Ordered   02/24/18 1447  For home use only DME Nebulizer machine  Once    Question Answer Comment  Patient needs a nebulizer to treat with the following condition COPD (chronic obstructive pulmonary disease) (HCC)   Patient needs a nebulizer to treat with the following condition Chronic bronchitis (HCC)   Patient needs a nebulizer to treat with the following condition Chronic bronchitis with acute exacerbation (HCC)      02/24/18 1446   02/24/18 1130  For home use only DME oxygen  Once    Question Answer Comment  Mode or (Route) Nasal cannula   Liters per Minute 2   Frequency Continuous (stationary and portable oxygen unit needed)   Oxygen conserving device Yes   Oxygen delivery system Gas      02/24/18 1129     Follow-up Information    Shawnie Dapper, PA-C. Schedule an appointment as soon as possible for a visit in 1 week(s).   Specialties:  Physician  Assistant, Internal Medicine Why:  Hospital Follow Up and recheck labs Contact information: [address] [place] [address] [phone]          No Known Allergies Allergies as of 02/24/2018   No Known Allergies     Medication List    TAKE these medications   aspirin EC 81 MG tablet Take 81 mg by mouth daily.   doxycycline 100 MG tablet Commonly known as:  ADOXA Take 1 tablet (100 mg total) by mouth 2 (two) times daily for 5 days.   fenofibrate 160 MG tablet Take 160 mg by mouth daily.   ipratropium-albuterol 0.5-2.5 (3) MG/3ML Soln Commonly known as:  DUONEB Take 3 mLs by nebulization every 4 (four) hours as needed (wheezing, coughing, SOB).   metoprolol succinate 50 MG 24 hr tablet Commonly known as:  TOPROL-XL Take 50 mg by mouth daily. Take with or immediately following a meal.   predniSONE 20 MG tablet Commonly known as:  DELTASONE Take 3 PO QAM x3days, 2 PO QAM x3days, 1 PO QAM x3days            Durable Medical Equipment  (From admission, onward)         Start     Ordered   02/24/18 1447  For home use only DME Nebulizer machine  Once    Question Answer Comment  Patient needs a nebulizer to treat with the following condition COPD (chronic obstructive pulmonary disease) (HCC)   Patient needs a nebulizer to treat with the following condition Chronic bronchitis (HCC)   Patient needs a nebulizer to treat with the following condition Chronic bronchitis with acute exacerbation (HCC)      02/24/18 1446   02/24/18 1130  For home use only DME oxygen  Once    Question Answer Comment  Mode or (Route) Nasal cannula   Liters per Minute 2   Frequency Continuous (stationary and portable oxygen unit needed)   Oxygen conserving device Yes   Oxygen delivery system Gas      02/24/18 1129      Procedures/Studies: Dg Chest Portable 1 View  Result Date: 02/22/2018 CLINICAL DATA:  Cough and body aches for 5 days. Shortness of breath. EXAM: PORTABLE CHEST 1 VIEW COMPARISON:  11/11/2017 FINDINGS: Hazy right lung base opacity suspicious for pneumonia. Unchanged heart size and mediastinal contours. There is central bronchial thickening. No evidence of pleural effusion. No pneumothorax. IMPRESSION: Hazy right lung base opacity suspicious for pneumonia. Followup PA and lateral chest X-ray is recommended in 3-4 weeks following trial of antibiotic therapy to ensure resolution and exclude underlying malignancy. Electronically Signed   By: Rubye Oaks M.D.   On: 02/22/2018 21:56      Subjective: Pt says she is feeling a lot better and wants to go home, not able to sleep well in hospital.   Discharge Exam: Vitals:   02/24/18 0915 02/24/18 0930  BP:    Pulse:    Resp:    Temp:    SpO2: (!) 74% 93%   Vitals:   02/23/18 2137 02/24/18 0600 02/24/18 0915 02/24/18 0930  BP:  107/69    Pulse:  (!) 59    Resp:  17    Temp:  97.8 F (36.6 C)    TempSrc:  Oral    SpO2: 90% 99% (!) 74% 93%  Weight:      Height:       General: Pt is alert, awake, not in  acute distress Cardiovascular: RRR, S1/S2 +, no  rubs, no gallops Respiratory: bibasilar wheezing heard, no rhonchi Abdominal: Soft, NT, ND, bowel sounds + Extremities: no edema, no cyanosis   The results of significant diagnostics from this hospitalization (including imaging, microbiology, ancillary and laboratory) are listed below for reference.     Microbiology: Recent Results (from the past 240 hour(s))  Respiratory Panel by PCR     Status: Abnormal   Collection Time: 02/23/18  2:17 AM  Result Value Ref Range Status   Adenovirus DETECTED (A) NOT DETECTED Final   Coronavirus 229E NOT DETECTED NOT DETECTED Final   Coronavirus HKU1 NOT DETECTED NOT DETECTED Final   Coronavirus NL63 NOT DETECTED NOT DETECTED Final   Coronavirus OC43 NOT DETECTED NOT DETECTED Final   Metapneumovirus NOT DETECTED NOT DETECTED Final   Rhinovirus / Enterovirus NOT DETECTED NOT DETECTED Final   Influenza A NOT DETECTED NOT DETECTED Final   Influenza B NOT DETECTED NOT DETECTED Final   Parainfluenza Virus 1 NOT DETECTED NOT DETECTED Final   Parainfluenza Virus 2 NOT DETECTED NOT DETECTED Final   Parainfluenza Virus 3 NOT DETECTED NOT DETECTED Final   Parainfluenza Virus 4 NOT DETECTED NOT DETECTED Final   Respiratory Syncytial Virus NOT DETECTED NOT DETECTED Final   Bordetella pertussis NOT DETECTED NOT DETECTED Final   Chlamydophila pneumoniae NOT DETECTED NOT DETECTED Final   Mycoplasma pneumoniae NOT DETECTED NOT DETECTED Final    Comment: Performed at [facility], [address] [place], [place] [address]  Culture, respiratory (NON-Expectorated)     Status: None (Preliminary result)   Collection Time: 02/23/18  7:00 AM  Result Value Ref Range Status   Specimen Description SPUTUM  Final   Special Requests NONE  Final   Gram Stain   Final    MODERATE WBC PRESENT, PREDOMINANTLY MONONUCLEAR FEW SQUAMOUS EPITHELIAL CELLS PRESENT MODERATE GRAM POSITIVE COCCI IN PAIRS FEW GRAM NEGATIVE  COCCOBACILLI RARE GRAM POSITIVE RODS    Culture   Final    CULTURE REINCUBATED FOR BETTER GROWTH Performed at [facility], [address] [place], [place] [address]    Report Status PENDING  Incomplete     Labs: BNP (last 3 results) Recent Labs    02/23/18 0916  BNP 62.0   Basic Metabolic Panel: Recent Labs  Lab 02/22/18 2221 02/23/18 0917 02/24/18 0617  NA 116* 116* 124*  K 3.7 3.9 4.4  CL 74* 77* 86*  CO2 31 27 30   GLUCOSE 131* 150* 119*  BUN 13 13 12   CREATININE 0.66 0.61 0.58  CALCIUM 8.6* 8.4* 8.3*  MG 1.7  --   --   PHOS 3.0  --   --    Liver Function Tests: Recent Labs  Lab 02/22/18 2221 02/23/18 0917  AST 93* 73*  ALT 57* 52  ALKPHOS 40 38  BILITOT 0.9 0.8  PROT 6.7 6.7  ALBUMIN 3.3* 3.4*   No results for input(s): LIPASE, AMYLASE in the last 168 hours. No results for input(s): AMMONIA in the last 168 hours. CBC: Recent Labs  Lab 02/22/18 2221 02/23/18 0917  WBC 6.4 6.9  NEUTROABS 4.2 5.9  HGB 13.9 13.7  HCT 39.9 39.3  MCV 86.4 85.6  PLT 154 167   Cardiac Enzymes: Recent Labs  Lab 02/22/18 2221  TROPONINI <0.03   BNP: Invalid input(s): POCBNP CBG: No results for input(s): GLUCAP in the last 168 hours. D-Dimer No results for input(s): DDIMER in the last 72 hours. Hgb A1c No results for input(s): HGBA1C in the last  72 hours. Lipid Profile No results for input(s): CHOL, HDL, LDLCALC, TRIG, CHOLHDL, LDLDIRECT in the last 72 hours. Thyroid function studies No results for input(s): TSH, T4TOTAL, T3FREE, THYROIDAB in the last 72 hours.  Invalid input(s): FREET3 Anemia work up No results for input(s): VITAMINB12, FOLATE, FERRITIN, TIBC, IRON, RETICCTPCT in the last 72 hours. Urinalysis    Component Value Date/Time   COLORURINE YELLOW 02/23/2018 0330   APPEARANCEUR CLEAR 02/23/2018 0330   LABSPEC 1.008 02/23/2018 0330   PHURINE 6.0 02/23/2018 0330   GLUCOSEU NEGATIVE 02/23/2018 0330   HGBUR MODERATE (A) 02/23/2018 0330    BILIRUBINUR NEGATIVE 02/23/2018 0330   KETONESUR NEGATIVE 02/23/2018 0330   PROTEINUR NEGATIVE 02/23/2018 0330   NITRITE POSITIVE (A) 02/23/2018 0330   LEUKOCYTESUR NEGATIVE 02/23/2018 0330   Sepsis Labs Invalid input(s): PROCALCITONIN,  WBC,  LACTICIDVEN Microbiology Recent Results (from the past 240 hour(s))  Respiratory Panel by PCR     Status: Abnormal   Collection Time: 02/23/18  2:17 AM  Result Value Ref Range Status   Adenovirus DETECTED (A) NOT DETECTED Final   Coronavirus 229E NOT DETECTED NOT DETECTED Final   Coronavirus HKU1 NOT DETECTED NOT DETECTED Final   Coronavirus NL63 NOT DETECTED NOT DETECTED Final   Coronavirus OC43 NOT DETECTED NOT DETECTED Final   Metapneumovirus NOT DETECTED NOT DETECTED Final   Rhinovirus / Enterovirus NOT DETECTED NOT DETECTED Final   Influenza A NOT DETECTED NOT DETECTED Final   Influenza B NOT DETECTED NOT DETECTED Final   Parainfluenza Virus 1 NOT DETECTED NOT DETECTED Final   Parainfluenza Virus 2 NOT DETECTED NOT DETECTED Final   Parainfluenza Virus 3 NOT DETECTED NOT DETECTED Final   Parainfluenza Virus 4 NOT DETECTED NOT DETECTED Final   Respiratory Syncytial Virus NOT DETECTED NOT DETECTED Final   Bordetella pertussis NOT DETECTED NOT DETECTED Final   Chlamydophila pneumoniae NOT DETECTED NOT DETECTED Final   Mycoplasma pneumoniae NOT DETECTED NOT DETECTED Final    Comment: Performed at [facility], [address] [place], [place] [address]  Culture, respiratory (NON-Expectorated)     Status: None (Preliminary result)   Collection Time: 02/23/18  7:00 AM  Result Value Ref Range Status   Specimen Description SPUTUM  Final   Special Requests NONE  Final   Gram Stain   Final    MODERATE WBC PRESENT, PREDOMINANTLY MONONUCLEAR FEW SQUAMOUS EPITHELIAL CELLS PRESENT MODERATE GRAM POSITIVE COCCI IN PAIRS FEW GRAM NEGATIVE COCCOBACILLI RARE GRAM POSITIVE RODS    Culture   Final    CULTURE REINCUBATED FOR BETTER  GROWTH Performed at [facility], [address] [place], [place] [address]    Report Status PENDING  Incomplete   Time coordinating discharge: 34 mins  SIGNED:  Standley Dakinslanford Iriel Nason, MD  [facility] 02/24/2018, 3:16 PM Pager [phone]  If 7PM-7AM, please contact night-coverage [email or web address] Password [email or web address]

## 2018-02-25 LAB — HIV ANTIBODY (ROUTINE TESTING W REFLEX): HIV SCREEN 4TH GENERATION: NONREACTIVE

## 2018-02-25 LAB — CULTURE, RESPIRATORY

## 2018-02-25 LAB — CULTURE, RESPIRATORY W GRAM STAIN: Culture: NORMAL

## 2018-02-26 DIAGNOSIS — J189 Pneumonia, unspecified organism: Secondary | ICD-10-CM | POA: Diagnosis not present

## 2018-03-06 DIAGNOSIS — Z6833 Body mass index (BMI) 33.0-33.9, adult: Secondary | ICD-10-CM | POA: Diagnosis not present

## 2018-03-06 DIAGNOSIS — E871 Hypo-osmolality and hyponatremia: Secondary | ICD-10-CM | POA: Diagnosis not present

## 2018-03-06 DIAGNOSIS — J189 Pneumonia, unspecified organism: Secondary | ICD-10-CM | POA: Diagnosis not present

## 2018-03-06 DIAGNOSIS — Z1389 Encounter for screening for other disorder: Secondary | ICD-10-CM | POA: Diagnosis not present

## 2018-03-06 DIAGNOSIS — R7309 Other abnormal glucose: Secondary | ICD-10-CM | POA: Diagnosis not present

## 2018-03-06 DIAGNOSIS — Z719 Counseling, unspecified: Secondary | ICD-10-CM | POA: Diagnosis not present

## 2018-03-06 DIAGNOSIS — E782 Mixed hyperlipidemia: Secondary | ICD-10-CM | POA: Diagnosis not present

## 2018-03-06 DIAGNOSIS — E6609 Other obesity due to excess calories: Secondary | ICD-10-CM | POA: Diagnosis not present

## 2018-03-24 ENCOUNTER — Ambulatory Visit (HOSPITAL_COMMUNITY)
Admission: RE | Admit: 2018-03-24 | Discharge: 2018-03-24 | Disposition: A | Discharge status: Home / Self Care | Payer: Federal, State, Local not specified - PPO | Source: Ambulatory Visit | Attending: Family Medicine | Admitting: Family Medicine

## 2018-03-24 ENCOUNTER — Other Ambulatory Visit (HOSPITAL_COMMUNITY): Payer: Self-pay | Admitting: Family Medicine

## 2018-03-24 DIAGNOSIS — J189 Pneumonia, unspecified organism: Secondary | ICD-10-CM

## 2018-03-24 DIAGNOSIS — Z8701 Personal history of pneumonia (recurrent): Secondary | ICD-10-CM | POA: Insufficient documentation

## 2018-11-22 DIAGNOSIS — Z1211 Encounter for screening for malignant neoplasm of colon: Secondary | ICD-10-CM | POA: Diagnosis not present

## 2018-11-25 DIAGNOSIS — R000 Tachycardia, unspecified: Secondary | ICD-10-CM | POA: Diagnosis not present

## 2018-11-25 DIAGNOSIS — Z0001 Encounter for general adult medical examination with abnormal findings: Secondary | ICD-10-CM | POA: Diagnosis not present

## 2018-11-25 DIAGNOSIS — R7309 Other abnormal glucose: Secondary | ICD-10-CM | POA: Diagnosis not present

## 2018-11-25 DIAGNOSIS — E782 Mixed hyperlipidemia: Secondary | ICD-10-CM | POA: Diagnosis not present

## 2018-11-25 DIAGNOSIS — F17290 Nicotine dependence, other tobacco product, uncomplicated: Secondary | ICD-10-CM | POA: Diagnosis not present

## 2018-11-25 DIAGNOSIS — I10 Essential (primary) hypertension: Secondary | ICD-10-CM | POA: Diagnosis not present

## 2018-11-25 DIAGNOSIS — Z6833 Body mass index (BMI) 33.0-33.9, adult: Secondary | ICD-10-CM | POA: Diagnosis not present

## 2018-11-25 DIAGNOSIS — Z1389 Encounter for screening for other disorder: Secondary | ICD-10-CM | POA: Diagnosis not present

## 2018-12-04 ENCOUNTER — Other Ambulatory Visit (HOSPITAL_COMMUNITY): Payer: Self-pay | Admitting: Family Medicine

## 2018-12-04 DIAGNOSIS — Z1231 Encounter for screening mammogram for malignant neoplasm of breast: Secondary | ICD-10-CM

## 2018-12-15 ENCOUNTER — Encounter (HOSPITAL_COMMUNITY): Payer: Self-pay

## 2018-12-15 ENCOUNTER — Other Ambulatory Visit (HOSPITAL_COMMUNITY): Payer: Self-pay | Admitting: *Deleted

## 2018-12-15 ENCOUNTER — Ambulatory Visit (HOSPITAL_COMMUNITY)
Admission: RE | Admit: 2018-12-15 | Discharge: 2018-12-15 | Disposition: A | Discharge status: Home / Self Care | Payer: Federal, State, Local not specified - PPO | Source: Ambulatory Visit | Attending: Family Medicine | Admitting: Family Medicine

## 2018-12-15 DIAGNOSIS — Z1231 Encounter for screening mammogram for malignant neoplasm of breast: Secondary | ICD-10-CM | POA: Diagnosis not present

## 2019-03-05 ENCOUNTER — Other Ambulatory Visit (HOSPITAL_COMMUNITY): Payer: Self-pay | Admitting: Family Medicine

## 2019-03-05 ENCOUNTER — Ambulatory Visit (HOSPITAL_COMMUNITY)
Admission: RE | Admit: 2019-03-05 | Discharge: 2019-03-05 | Disposition: A | Discharge status: Home / Self Care | Payer: Federal, State, Local not specified - PPO | Source: Ambulatory Visit | Attending: Family Medicine | Admitting: Family Medicine

## 2019-03-05 ENCOUNTER — Other Ambulatory Visit: Payer: Self-pay

## 2019-03-05 DIAGNOSIS — S8392XA Sprain of unspecified site of left knee, initial encounter: Secondary | ICD-10-CM | POA: Diagnosis not present

## 2019-03-05 DIAGNOSIS — Z1389 Encounter for screening for other disorder: Secondary | ICD-10-CM | POA: Diagnosis not present

## 2019-03-05 DIAGNOSIS — M25562 Pain in left knee: Secondary | ICD-10-CM

## 2019-03-05 DIAGNOSIS — Z6833 Body mass index (BMI) 33.0-33.9, adult: Secondary | ICD-10-CM | POA: Diagnosis not present

## 2019-03-05 DIAGNOSIS — E6609 Other obesity due to excess calories: Secondary | ICD-10-CM | POA: Diagnosis not present

## 2019-06-17 DIAGNOSIS — E559 Vitamin D deficiency, unspecified: Secondary | ICD-10-CM | POA: Diagnosis not present

## 2019-06-17 DIAGNOSIS — D509 Iron deficiency anemia, unspecified: Secondary | ICD-10-CM | POA: Diagnosis not present

## 2019-06-17 DIAGNOSIS — Z79899 Other long term (current) drug therapy: Secondary | ICD-10-CM | POA: Diagnosis not present

## 2019-06-17 DIAGNOSIS — N183 Chronic kidney disease, stage 3 (moderate): Secondary | ICD-10-CM | POA: Diagnosis not present

## 2019-06-30 IMAGING — MG DIGITAL SCREENING BILATERAL MAMMOGRAM WITH TOMO AND CAD
6 of 10 series · 6 of 30 positions shown · non-contrast
Comparison: Previous exam(s).

CLINICAL DATA: Screening.

EXAM:
DIGITAL SCREENING BILATERAL MAMMOGRAM WITH TOMO AND CAD

[L CC synth-2D]
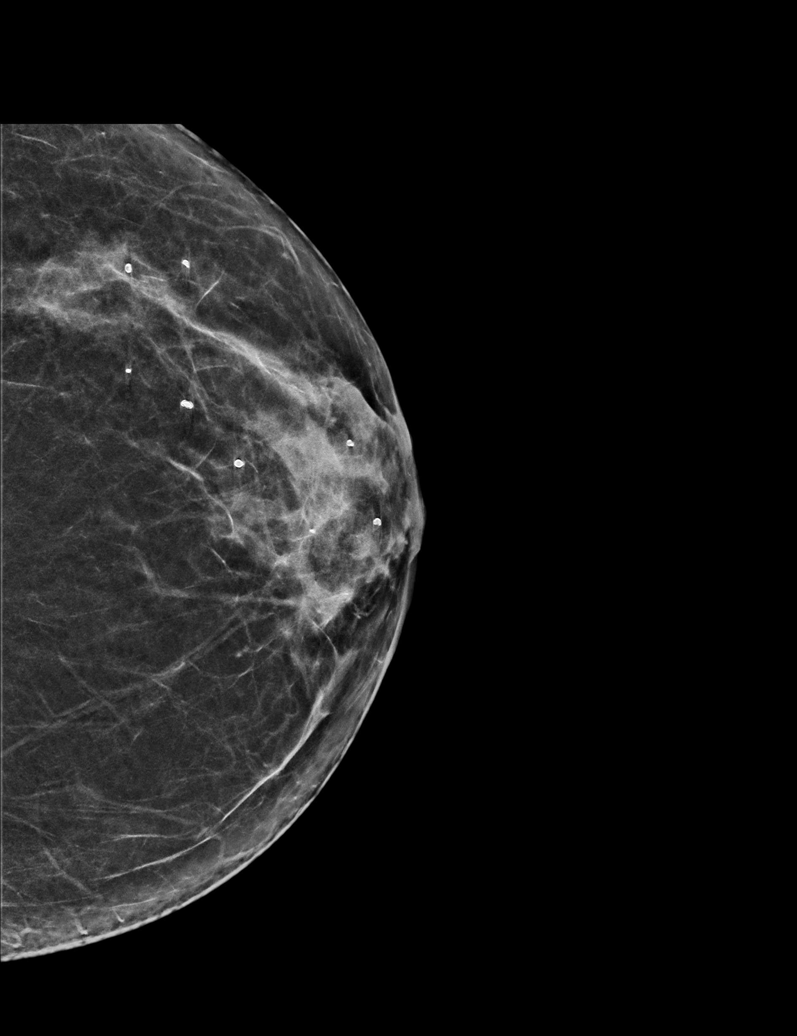

[R CC synth-2D]
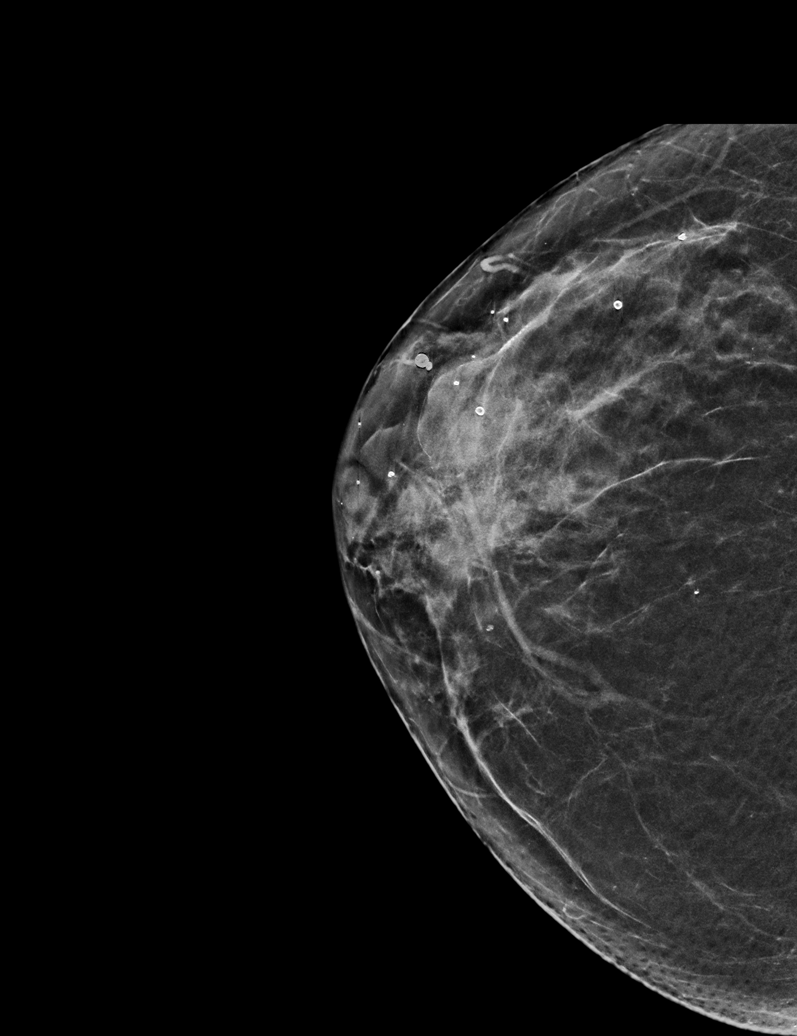

[L MLO synth-2D]
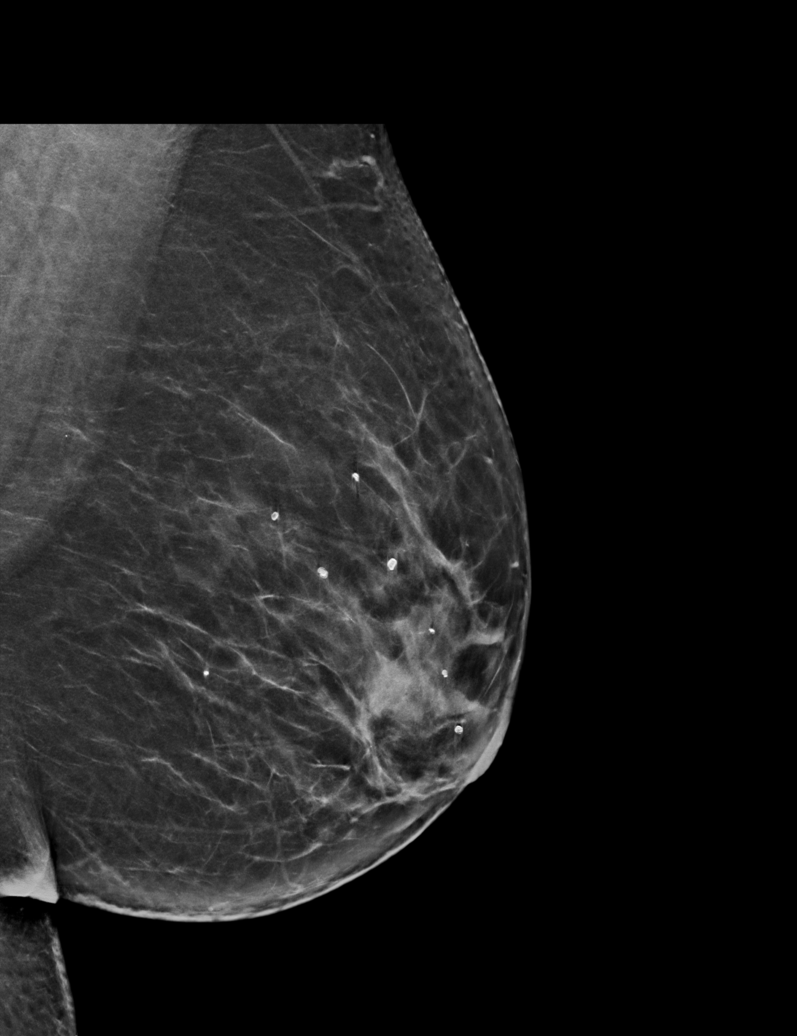

[R MLO synth-2D (1 of 2)]
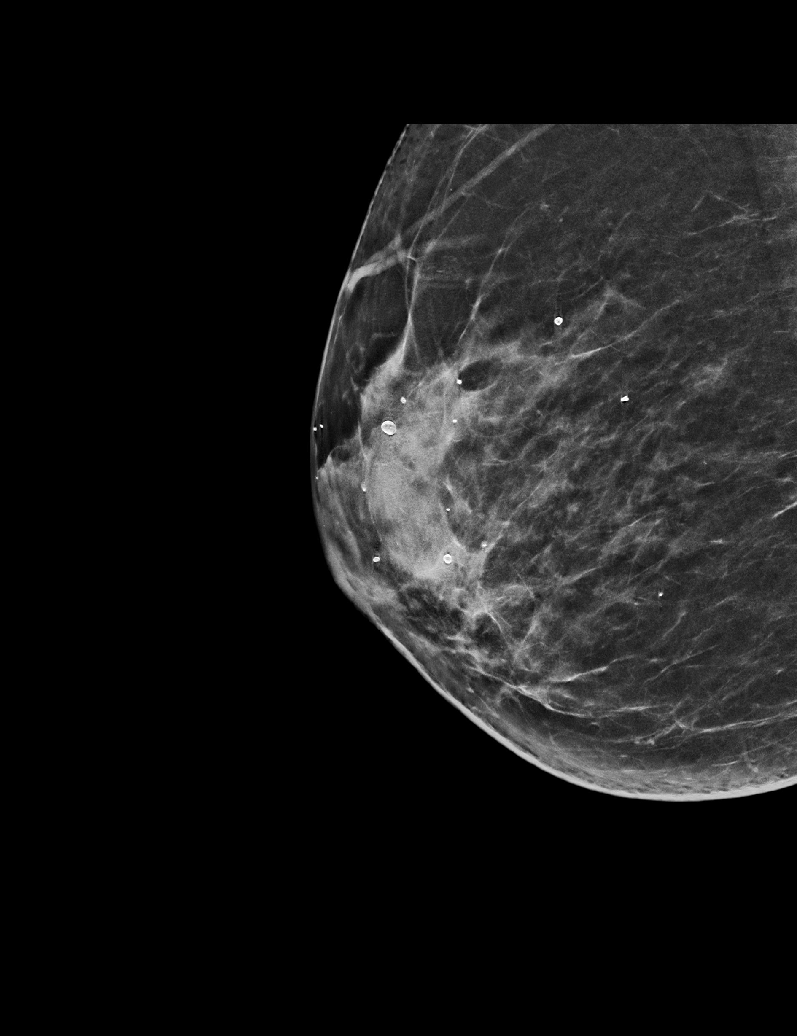

[R MLO synth-2D (2 of 2)]
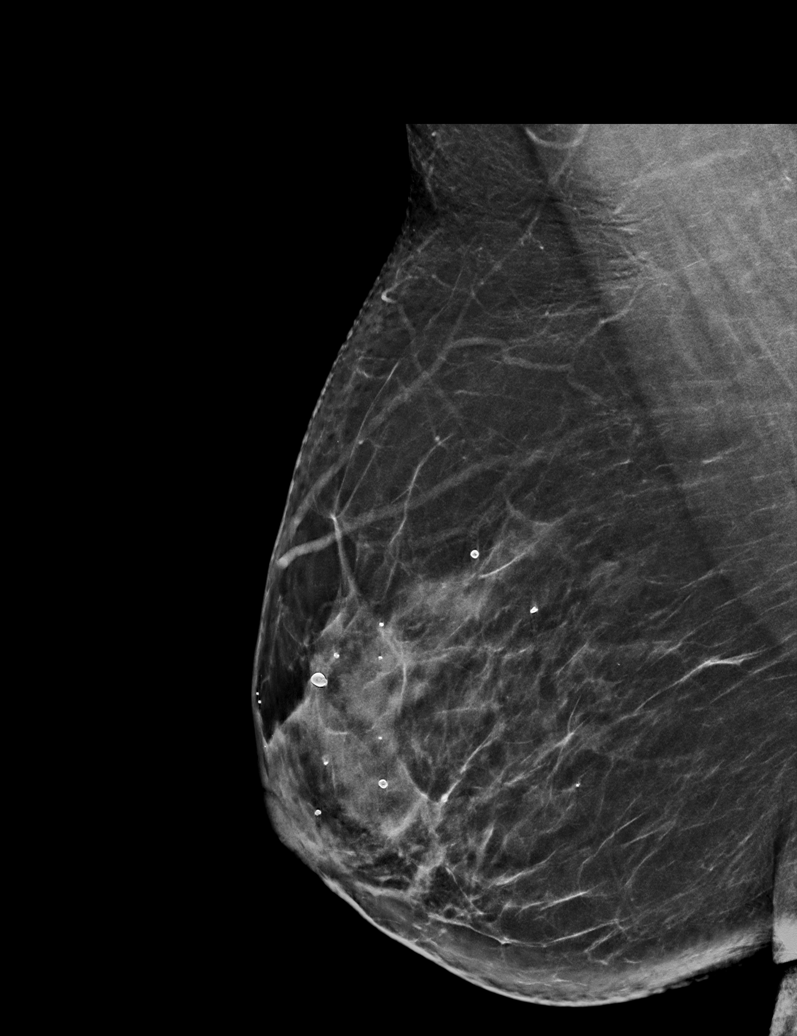

[L MLO tomo · tomo slice 35/70.0]
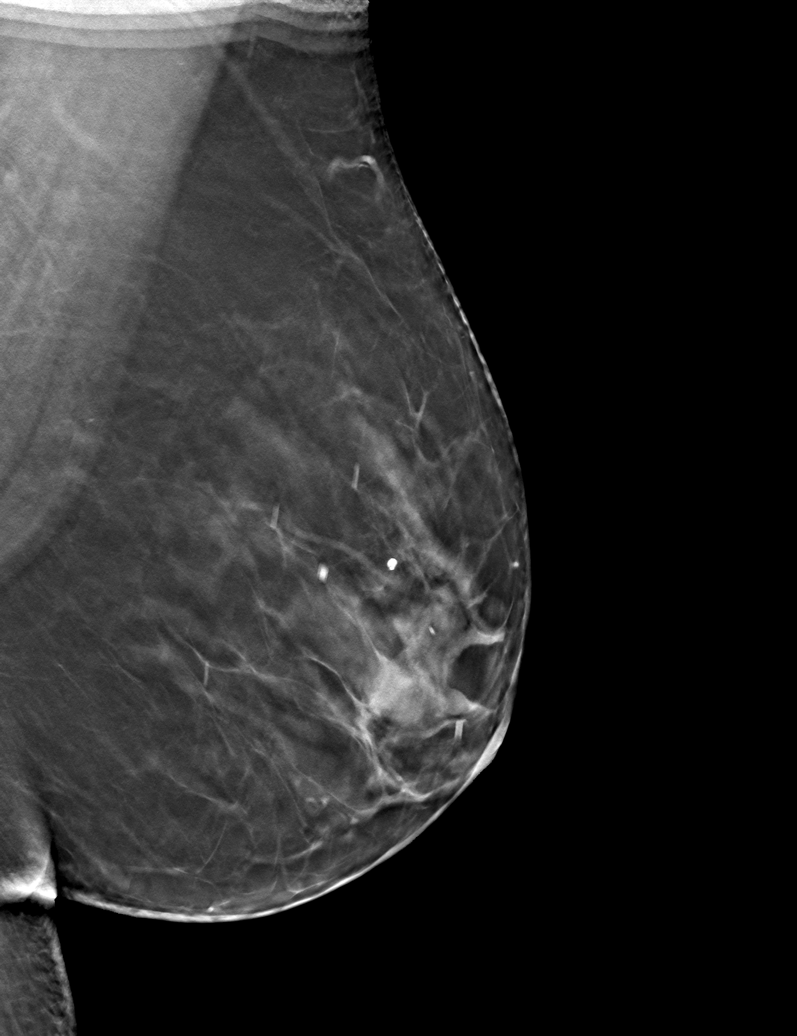

[6 of 30 positions shown; findings below may reference images not displayed]

ACR Breast Density Category c: The breast tissue is heterogeneously
dense, which may obscure small masses.
FINDINGS: There are no findings suspicious for malignancy. Images were
processed with CAD.
IMPRESSION: No mammographic evidence of malignancy. A result letter of this
screening mammogram will be mailed directly to the patient.

RECOMMENDATION:
Screening mammogram in one year. (Code:[id number])

BI-RADS CATEGORY  1: Negative.

## 2019-12-08 DIAGNOSIS — Z719 Counseling, unspecified: Secondary | ICD-10-CM | POA: Diagnosis not present

## 2019-12-08 DIAGNOSIS — Z6836 Body mass index (BMI) 36.0-36.9, adult: Secondary | ICD-10-CM | POA: Diagnosis not present

## 2019-12-08 DIAGNOSIS — E781 Pure hyperglyceridemia: Secondary | ICD-10-CM | POA: Diagnosis not present

## 2019-12-08 DIAGNOSIS — R000 Tachycardia, unspecified: Secondary | ICD-10-CM | POA: Diagnosis not present

## 2019-12-08 DIAGNOSIS — J449 Chronic obstructive pulmonary disease, unspecified: Secondary | ICD-10-CM | POA: Diagnosis not present

## 2019-12-08 DIAGNOSIS — R7309 Other abnormal glucose: Secondary | ICD-10-CM | POA: Diagnosis not present

## 2019-12-08 DIAGNOSIS — I10 Essential (primary) hypertension: Secondary | ICD-10-CM | POA: Diagnosis not present

## 2019-12-08 DIAGNOSIS — Z0001 Encounter for general adult medical examination with abnormal findings: Secondary | ICD-10-CM | POA: Diagnosis not present

## 2019-12-08 DIAGNOSIS — F17290 Nicotine dependence, other tobacco product, uncomplicated: Secondary | ICD-10-CM | POA: Diagnosis not present

## 2019-12-09 ENCOUNTER — Other Ambulatory Visit (HOSPITAL_COMMUNITY): Payer: Self-pay | Admitting: Family Medicine

## 2019-12-09 DIAGNOSIS — Z1231 Encounter for screening mammogram for malignant neoplasm of breast: Secondary | ICD-10-CM

## 2019-12-21 ENCOUNTER — Ambulatory Visit (HOSPITAL_COMMUNITY)
Admission: RE | Admit: 2019-12-21 | Discharge: 2019-12-21 | Disposition: A | Discharge status: Home / Self Care | Payer: Federal, State, Local not specified - PPO | Source: Ambulatory Visit | Attending: Family Medicine | Admitting: Family Medicine

## 2019-12-21 ENCOUNTER — Other Ambulatory Visit: Payer: Self-pay

## 2019-12-21 ENCOUNTER — Other Ambulatory Visit (HOSPITAL_COMMUNITY): Payer: Self-pay | Admitting: Family Medicine

## 2019-12-21 DIAGNOSIS — J449 Chronic obstructive pulmonary disease, unspecified: Secondary | ICD-10-CM

## 2019-12-21 DIAGNOSIS — Z1231 Encounter for screening mammogram for malignant neoplasm of breast: Secondary | ICD-10-CM | POA: Diagnosis not present

## 2019-12-21 DIAGNOSIS — R05 Cough: Secondary | ICD-10-CM | POA: Diagnosis not present

## 2020-12-22 DIAGNOSIS — A46 Erysipelas: Secondary | ICD-10-CM | POA: Diagnosis not present

## 2020-12-22 DIAGNOSIS — Z0001 Encounter for general adult medical examination with abnormal findings: Secondary | ICD-10-CM | POA: Diagnosis not present

## 2020-12-22 DIAGNOSIS — Z1331 Encounter for screening for depression: Secondary | ICD-10-CM | POA: Diagnosis not present

## 2020-12-22 DIAGNOSIS — R7309 Other abnormal glucose: Secondary | ICD-10-CM | POA: Diagnosis not present

## 2020-12-22 DIAGNOSIS — Z1389 Encounter for screening for other disorder: Secondary | ICD-10-CM | POA: Diagnosis not present

## 2020-12-22 DIAGNOSIS — E781 Pure hyperglyceridemia: Secondary | ICD-10-CM | POA: Diagnosis not present

## 2020-12-22 DIAGNOSIS — R000 Tachycardia, unspecified: Secondary | ICD-10-CM | POA: Diagnosis not present

## 2020-12-22 DIAGNOSIS — F17290 Nicotine dependence, other tobacco product, uncomplicated: Secondary | ICD-10-CM | POA: Diagnosis not present

## 2020-12-22 DIAGNOSIS — Z6837 Body mass index (BMI) 37.0-37.9, adult: Secondary | ICD-10-CM | POA: Diagnosis not present

## 2020-12-22 DIAGNOSIS — Z23 Encounter for immunization: Secondary | ICD-10-CM | POA: Diagnosis not present

## 2021-06-29 DIAGNOSIS — Z719 Counseling, unspecified: Secondary | ICD-10-CM | POA: Diagnosis not present

## 2021-06-29 DIAGNOSIS — E6609 Other obesity due to excess calories: Secondary | ICD-10-CM | POA: Diagnosis not present

## 2021-06-29 DIAGNOSIS — E781 Pure hyperglyceridemia: Secondary | ICD-10-CM | POA: Diagnosis not present

## 2021-06-29 DIAGNOSIS — F17210 Nicotine dependence, cigarettes, uncomplicated: Secondary | ICD-10-CM | POA: Diagnosis not present

## 2021-06-29 DIAGNOSIS — J449 Chronic obstructive pulmonary disease, unspecified: Secondary | ICD-10-CM | POA: Diagnosis not present

## 2021-06-29 DIAGNOSIS — Z23 Encounter for immunization: Secondary | ICD-10-CM | POA: Diagnosis not present

## 2021-06-29 DIAGNOSIS — R7309 Other abnormal glucose: Secondary | ICD-10-CM | POA: Diagnosis not present

## 2021-06-29 DIAGNOSIS — I10 Essential (primary) hypertension: Secondary | ICD-10-CM | POA: Diagnosis not present

## 2021-07-06 ENCOUNTER — Encounter: Payer: Self-pay | Admitting: Internal Medicine

## 2021-09-04 ENCOUNTER — Ambulatory Visit (INDEPENDENT_AMBULATORY_CARE_PROVIDER_SITE_OTHER): Payer: Self-pay | Admitting: *Deleted

## 2021-09-04 ENCOUNTER — Other Ambulatory Visit: Payer: Self-pay

## 2021-09-04 VITALS — Ht 66.0 in | Wt 203.0 lb

## 2021-09-04 DIAGNOSIS — Z1211 Encounter for screening for malignant neoplasm of colon: Secondary | ICD-10-CM

## 2021-09-04 MED ORDER — PEG 3350-KCL-NA BICARB-NACL 420 G PO SOLR: 4000.0000 mL | Freq: Once | ORAL | 0 refills | Status: AC

## 2021-09-04 NOTE — Patient Instructions (Addendum)
Remember labs on 09/08/2021.    Cynthia [name]   03-30-1956 MRN: [id number] Procedure Date: 09/12/2021 Arrival Time: 7:15 AM     Location of Procedure: [name] Short Stay  PREPARATION FOR COLONOSCOPY WITH TRI-LYTE PREP   Please notify us immediately if you are diabetic, take iron supplements, or if you are on Coumadin or any other blood thinners.   Weight loss medications must be held 7 days prior to your procedure.   Please notify us of any medication changes at least 7 days prior to your procedure.   Please hold the following medications: n/a  You will need to purchase 1 fleet enema and 1 box of Bisacodyl 5mg  tablets.   PROCEDURE IS SCHEDULED FOR Cynthia [name] AS FOLLOWS:  Procedure Date: 09/12/2021 Time to register: 7:15 am Place to register: 11/12/2021 Short Stay Scheduled provider: Dr. Jeani [name]   2 DAYS BEFORE PROCEDURE:  DATE: 09/10/2021   DAY: Sunday Begin clear liquid diet AFTER your lunch meal. NO SOLID FOODS!   1 DAY BEFORE PROCEDURE:  DATE: 09/11/2021   DAY: Monday  Continue clear liquids the entire day - NO SOLID FOOD.   Diabetic medications adjustments for today: n/a  At 12:00pm (noon): Take 2 (two) Dulcolax (Bisacodyl) tablets  At 2:00pm: Start drinking your solution. Try to drink 1 (one) 8 ounce glass every 10-15 minutes, until you have consumed HALF the jug. (You should complete the first 1/2 of the jug in 2 hours. Wait 30 minutes, then drink 3-4 more glasses of the solution. Your stools should be clear; if not, you may have to consume the rest of the jug.   One hour after completing the solution: take the last 2 (two) Dulcolax (Bisacodyl) tablets, with a clear liquid.  YOU MUST DRINK PLENTY OF CLEAR LIQUIDS DURING YOUR PREP TO REDUCE RISKS OF KIDNEY FAILURE.   Continue clear liquids only. EXCEPTION:  If you take medications for your heart, blood pressure or breathing, you may take these medications with a small amount of clear liquid the morning  of your procedure.     DAY OF PROCEDURE:   DATE: 09/12/2021       DAY: Tuesday The morning of your procedure give yourself 1 (one) Fleet Enema, at least 1 hour before going to the hospital.  Nothing by mouth after 5:45 am.    You may take Tylenol products. Please continue your regular medications unless we have instructed otherwise.   Diabetic medications adjustments for today. N/A  Someone MUST be available to drive you home; the hospital will cancel this appointment if you do not have a driver.   Please call the office if you have any questions (Dept: [phone]).  Please see below for Dietary Information.  CLEAR LIQUIDS INCLUDE:  Water Jello (NOT red in color)   Ice Popsicles (NOT red in color)   Tea (sugar ok, no milk/cream) Powdered fruit flavored drinks  Coffee (sugar ok, no milk/cream) Gatorade/ Lemonade/ Kool-Aid  (NOT red in color)   Juice: apple, white grape, white cranberry Soft drinks  Clear bullion, consomme, broth (fat free beef/chicken/vegetable)  Carbonated beverages (any kind)  Strained chicken noodle soup Hard Candy   REMEMBER: Clear liquids are liquids that will allow you to see your fingers on the other side of a clear glass. Be sure liquids are NOT red in color, and not cloudy, but CLEAR.   DO NOT EAT OR DRINK ANY OF THE FOLLOWING:  Dairy products of any kind   Cranberry juice  Tomato juice / V8 juice   Grapefruit juice Orange juice     Red grape juice  Do not eat any solid foods, including such foods as: cereal, oatmeal, yogurt, fruits, vegetables, creamed soups, eggs, bread, etc.    HELPFUL HINTS FOR DRINKING PREP SOLUTION:  Make sure prep is extremely cold. Refrigerate the night before. You may also put in the freezer.  You may try mixing some Crystal Light or Country Time Lemonade if you prefer. Mix in small amounts; add more if necessary. Try drinking through a straw Rinse mouth with water or a mouthwash between glasses, to remove  after-taste. Try sipping on a cold beverage /ice/ popsicles between glasses of prep Place a piece of sugar-free hard candy in mouth between glasses If you become nauseated, try consuming smaller amounts, or stretch out the time between glasses. Stop for 30-60 minutes, then slowly start back drinking    You may call the office (Dept: [phone]) before 5:00pm, or page the doctor on call after 5:00pm [phone]), for further instructions, if necessary.   OTHER INSTRUCTIONS  You will need to have a responsible adult immediately available after your procedure to receive discharge instructions then drive you home. We strongly encourage your responsible adult to remain in the hospital during your procedure.  Your procedure will be canceled if a responsible adult is not available.  Wear loose fitting clothing that is easily removed.  Leave jewelry and other valuables at home.   Remove all body piercing jewelry and leave at home.  Total time from sign-in until discharge is approximately 2-3 hours.  You should go home directly after your procedure and rest. You can resume normal activities the day after your procedure.  The day of your procedure you should not: Drive Make legal decisions Operate machinery Drink alcohol Return to work

## 2021-09-04 NOTE — Progress Notes (Signed)
OK to schedule. ASA II 

## 2021-09-04 NOTE — Progress Notes (Signed)
Gastroenterology Pre-Procedure Review  Request Date: 09/04/2021 Requesting Physician: Dr. Phillips Odor @ [facility], 10 year recall, Last TCS 02/10/2007 done by Dr. Darrick Penna, normal colon  PATIENT REVIEW QUESTIONS: The patient responded to the following health history questions as indicated:    1. Diabetes Melitis: yes, type II but controlled by diet and exercise 2. Joint replacements in the past 12 months: no 3. Major health problems in the past 3 months: no 4. Has an artificial valve or MVP: no 5. Has a defibrillator: no 6. Has been advised in past to take antibiotics in advance of a procedure like teeth cleaning: no 7. Family history of colon cancer: no  8. Alcohol Use: no 9. Illicit drug Use: no 10. History of sleep apnea: no  11. History of coronary artery or other vascular stents placed within the last 12 months: no 12. History of any prior anesthesia complications: no 13. Body mass index is 32.77 kg/m.    MEDICATIONS & ALLERGIES:    Patient reports the following regarding taking any blood thinners:   Plavix? no Aspirin? Yes, 81 mg Coumadin? no Brilinta? no Xarelto? no Eliquis? no Pradaxa? no Savaysa? no Effient? no  Patient confirms/reports the following medications:  Current Outpatient Medications  Medication Sig Dispense Refill   aspirin EC 81 MG tablet Take 81 mg by mouth daily.     fenofibrate 160 MG tablet Take 145 mg by mouth daily. Takes 145 mg daily.     olmesartan-hydrochlorothiazide (BENICAR HCT) 40-25 MG tablet Take 1 tablet by mouth daily.     No current facility-administered medications for this visit.    Patient confirms/reports the following allergies:  No Known Allergies  No orders of the defined types were placed in this encounter.   AUTHORIZATION INFORMATION Primary Insurance: [employer] Employee,  [place] #: [id number],  Group #: [id number] Pre-Cert / Auth required: No, not required  SCHEDULE INFORMATION: Procedure has been scheduled as follows:   Date: 09/12/2021, Time: 8:45  Location: [facility] with Dr. Marletta Lor  This Gastroenterology Pre-Precedure Review Form is being routed to the following provider(s):  Ermalinda Memos, PA-C

## 2021-09-05 ENCOUNTER — Other Ambulatory Visit: Payer: Self-pay | Admitting: *Deleted

## 2021-09-05 DIAGNOSIS — Z1211 Encounter for screening for malignant neoplasm of colon: Secondary | ICD-10-CM

## 2021-09-08 ENCOUNTER — Encounter: Payer: Self-pay | Admitting: *Deleted

## 2021-09-08 ENCOUNTER — Telehealth: Payer: Self-pay | Admitting: *Deleted
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
## 2021-09-08 NOTE — Telephone Encounter (Signed)
Pt called back to let me know she received my messages.  She informed me that she would like to cancel procedure.  She did not wish to reschedule procedure right now.  Informed me that she would have to get back with me.  Communication relayed to Selena Batten in Endo so she could remove pt from depot.

## 2021-09-11 NOTE — Telephone Encounter (Signed)
Noted  

## 2021-09-12 ENCOUNTER — Encounter (HOSPITAL_COMMUNITY): Admission: RE | Payer: Self-pay | Source: Ambulatory Visit

## 2021-09-12 ENCOUNTER — Ambulatory Visit (HOSPITAL_COMMUNITY): Admission: RE | Admit: 2021-09-12 | Payer: Federal, State, Local not specified - PPO | Source: Ambulatory Visit

## 2021-09-12 SURGERY — COLONOSCOPY WITH PROPOFOL
Anesthesia: Monitor Anesthesia Care

## 2021-10-01 ENCOUNTER — Encounter (HOSPITAL_COMMUNITY): Payer: Self-pay | Admitting: *Deleted

## 2021-10-01 ENCOUNTER — Other Ambulatory Visit: Payer: Self-pay

## 2021-10-01 ENCOUNTER — Inpatient Hospital Stay (HOSPITAL_COMMUNITY)
Admission: EM | Admit: 2021-10-01 | Discharge: 2021-10-02 | DRG: 640 | Discharge status: Against Medical Advice | Payer: Federal, State, Local not specified - PPO | Attending: Internal Medicine | Admitting: Internal Medicine

## 2021-10-01 ENCOUNTER — Emergency Department (HOSPITAL_COMMUNITY): Payer: Federal, State, Local not specified - PPO

## 2021-10-01 DIAGNOSIS — Z720 Tobacco use: Secondary | ICD-10-CM | POA: Diagnosis present

## 2021-10-01 DIAGNOSIS — I10 Essential (primary) hypertension: Secondary | ICD-10-CM | POA: Diagnosis not present

## 2021-10-01 DIAGNOSIS — Z90710 Acquired absence of both cervix and uterus: Secondary | ICD-10-CM

## 2021-10-01 DIAGNOSIS — J441 Chronic obstructive pulmonary disease with (acute) exacerbation: Secondary | ICD-10-CM | POA: Diagnosis present

## 2021-10-01 DIAGNOSIS — J189 Pneumonia, unspecified organism: Secondary | ICD-10-CM | POA: Diagnosis present

## 2021-10-01 DIAGNOSIS — J209 Acute bronchitis, unspecified: Secondary | ICD-10-CM

## 2021-10-01 DIAGNOSIS — Z833 Family history of diabetes mellitus: Secondary | ICD-10-CM | POA: Diagnosis not present

## 2021-10-01 DIAGNOSIS — E7800 Pure hypercholesterolemia, unspecified: Secondary | ICD-10-CM | POA: Diagnosis present

## 2021-10-01 DIAGNOSIS — E781 Pure hyperglyceridemia: Secondary | ICD-10-CM | POA: Diagnosis not present

## 2021-10-01 DIAGNOSIS — R059 Cough, unspecified: Secondary | ICD-10-CM | POA: Diagnosis not present

## 2021-10-01 DIAGNOSIS — J440 Chronic obstructive pulmonary disease with acute lower respiratory infection: Secondary | ICD-10-CM | POA: Diagnosis not present

## 2021-10-01 DIAGNOSIS — Z7982 Long term (current) use of aspirin: Secondary | ICD-10-CM

## 2021-10-01 DIAGNOSIS — Z20822 Contact with and (suspected) exposure to covid-19: Secondary | ICD-10-CM | POA: Diagnosis present

## 2021-10-01 DIAGNOSIS — E871 Hypo-osmolality and hyponatremia: Principal | ICD-10-CM | POA: Diagnosis present

## 2021-10-01 DIAGNOSIS — J9811 Atelectasis: Secondary | ICD-10-CM | POA: Diagnosis not present

## 2021-10-01 DIAGNOSIS — F17210 Nicotine dependence, cigarettes, uncomplicated: Secondary | ICD-10-CM | POA: Diagnosis present

## 2021-10-01 DIAGNOSIS — Z28310 Unvaccinated for covid-19: Secondary | ICD-10-CM

## 2021-10-01 DIAGNOSIS — Z79899 Other long term (current) drug therapy: Secondary | ICD-10-CM

## 2021-10-01 LAB — URINALYSIS, ROUTINE W REFLEX MICROSCOPIC
Bilirubin Urine: NEGATIVE
Glucose, UA: NEGATIVE mg/dL
Ketones, ur: NEGATIVE mg/dL
Nitrite: NEGATIVE
Protein, ur: NEGATIVE mg/dL
Specific Gravity, Urine: 1.01 (ref 1.005–1.030)
pH: 6 (ref 5.0–8.0)

## 2021-10-01 LAB — CBC WITH DIFFERENTIAL/PLATELET
Abs Immature Granulocytes: 0.2 10*3/uL — ABNORMAL HIGH (ref 0.00–0.07)
Basophils Absolute: 0.1 10*3/uL (ref 0.0–0.1)
Basophils Relative: 1 %
Eosinophils Absolute: 0.2 10*3/uL (ref 0.0–0.5)
Eosinophils Relative: 1 %
HCT: 39.8 % (ref 36.0–46.0)
Hemoglobin: 14 g/dL (ref 12.0–15.0)
Immature Granulocytes: 2 %
Lymphocytes Relative: 19 %
Lymphs Abs: 2.7 10*3/uL (ref 0.7–4.0)
MCH: 31.7 pg (ref 26.0–34.0)
MCHC: 35.2 g/dL (ref 30.0–36.0)
MCV: 90 fL (ref 80.0–100.0)
Monocytes Absolute: 0.8 10*3/uL (ref 0.1–1.0)
Monocytes Relative: 6 %
Neutro Abs: 9.8 10*3/uL — ABNORMAL HIGH (ref 1.7–7.7)
Neutrophils Relative %: 71 %
Platelets: 279 10*3/uL (ref 150–400)
RBC: 4.42 MIL/uL (ref 3.87–5.11)
RDW: 12 % (ref 11.5–15.5)
WBC: 13.8 10*3/uL — ABNORMAL HIGH (ref 4.0–10.5)
nRBC: 0 % (ref 0.0–0.2)

## 2021-10-01 LAB — BRAIN NATRIURETIC PEPTIDE: B Natriuretic Peptide: 12 pg/mL (ref 0.0–100.0)

## 2021-10-01 LAB — BASIC METABOLIC PANEL
Anion gap: 8 (ref 5–15)
Anion gap: 9 (ref 5–15)
BUN: 19 mg/dL (ref 8–23)
BUN: 18 mg/dL (ref 8–23)
CO2: 28 mmol/L (ref 22–32)
CO2: 26 mmol/L (ref 22–32)
Calcium: 8.5 mg/dL — ABNORMAL LOW (ref 8.9–10.3)
Calcium: 8.1 mg/dL — ABNORMAL LOW (ref 8.9–10.3)
Chloride: 79 mmol/L — ABNORMAL LOW (ref 98–111)
Chloride: 80 mmol/L — ABNORMAL LOW (ref 98–111)
Creatinine, Ser: 0.66 mg/dL (ref 0.44–1.00)
Creatinine, Ser: 0.67 mg/dL (ref 0.44–1.00)
GFR, Estimated: >60 mL/min (ref >60)
GFR, Estimated: >60 mL/min (ref >60)
Glucose, Bld: 99 mg/dL (ref 70–99)
Glucose, Bld: 131 mg/dL — ABNORMAL HIGH (ref 70–99)
Potassium: 4.7 mmol/L (ref 3.5–5.1)
Potassium: 4.3 mmol/L (ref 3.5–5.1)
Sodium: 115 mmol/L — CL (ref 135–145)
Sodium: 115 mmol/L — CL (ref 135–145)

## 2021-10-01 LAB — CREATININE, URINE, RANDOM: Creatinine, Urine: 51.98 mg/dL

## 2021-10-01 LAB — SODIUM, URINE, RANDOM: Sodium, Ur: 50 mmol/L

## 2021-10-01 LAB — RESP PANEL BY RT-PCR (FLU A&B, COVID) ARPGX2
Influenza A by PCR: NEGATIVE
Influenza B by PCR: NEGATIVE
SARS Coronavirus 2 by RT PCR: NEGATIVE

## 2021-10-01 MED ORDER — ONDANSETRON HCL 4 MG/2ML IJ SOLN: 4.0000 mg | Freq: Four times a day (QID) | INTRAMUSCULAR | Status: DC | PRN

## 2021-10-01 MED ORDER — SODIUM CHLORIDE 0.9 % IV BOLUS
500.0000 mL | Freq: Once | INTRAVENOUS | Status: AC
  Administered 2021-10-01: 500 mL via INTRAVENOUS

## 2021-10-01 MED ORDER — SODIUM CHLORIDE 0.9 % IV SOLN
500.0000 mg | Freq: Once | INTRAVENOUS | Status: AC
  Administered 2021-10-01: 500 mg via INTRAVENOUS
  Filled 2021-10-01: qty 500

## 2021-10-01 MED ORDER — IPRATROPIUM-ALBUTEROL 0.5-2.5 (3) MG/3ML IN SOLN
3.0000 mL | Freq: Four times a day (QID) | RESPIRATORY_TRACT | Status: DC
  Administered 2021-10-01: 3 mL via RESPIRATORY_TRACT
  Filled 2021-10-01: qty 3

## 2021-10-01 MED ORDER — SODIUM CHLORIDE 0.9 % IV SOLN
1.0000 g | Freq: Once | INTRAVENOUS | Status: AC
  Administered 2021-10-01: 1 g via INTRAVENOUS
  Filled 2021-10-01: qty 10

## 2021-10-01 MED ORDER — METHYLPREDNISOLONE SODIUM SUCC 125 MG IJ SOLR
125.0000 mg | Freq: Once | INTRAMUSCULAR | Status: AC
  Administered 2021-10-01: 125 mg via INTRAVENOUS
  Filled 2021-10-01: qty 2

## 2021-10-01 MED ORDER — FENOFIBRATE 160 MG PO TABS
160.0000 mg | ORAL_TABLET | Freq: Every day | ORAL | Status: DC
  Filled 2021-10-01: qty 1

## 2021-10-01 MED ORDER — ALBUTEROL SULFATE HFA 108 (90 BASE) MCG/ACT IN AERS
INHALATION_SPRAY | RESPIRATORY_TRACT | Status: AC
  Administered 2021-10-01: 4 via RESPIRATORY_TRACT
  Filled 2021-10-01: qty 6.7

## 2021-10-01 MED ORDER — AZITHROMYCIN 500 MG IV SOLR: 500.0000 mg | INTRAVENOUS | Status: DC

## 2021-10-01 MED ORDER — ASPIRIN EC 81 MG PO TBEC
81.0000 mg | DELAYED_RELEASE_TABLET | Freq: Every day | ORAL | Status: DC
  Filled 2021-10-01: qty 1

## 2021-10-01 MED ORDER — IRBESARTAN 150 MG PO TABS
300.0000 mg | ORAL_TABLET | Freq: Every day | ORAL | Status: DC
  Filled 2021-10-01: qty 2

## 2021-10-01 MED ORDER — ALBUTEROL SULFATE (2.5 MG/3ML) 0.083% IN NEBU: 2.5000 mg | INHALATION_SOLUTION | RESPIRATORY_TRACT | Status: DC | PRN

## 2021-10-01 MED ORDER — ENOXAPARIN SODIUM 40 MG/0.4ML IJ SOSY: 40.0000 mg | PREFILLED_SYRINGE | INTRAMUSCULAR | Status: DC

## 2021-10-01 MED ORDER — CEFTRIAXONE SODIUM 2 G IJ SOLR
2.0000 g | INTRAMUSCULAR | Status: DC
Start: 2021-10-02 — End: 2021-10-02

## 2021-10-01 MED ORDER — SODIUM CHLORIDE 0.9 % IV SOLN: INTRAVENOUS | Status: DC

## 2021-10-01 MED ORDER — ALBUTEROL SULFATE HFA 108 (90 BASE) MCG/ACT IN AERS
4.0000 | INHALATION_SPRAY | Freq: Once | RESPIRATORY_TRACT | Status: AC
  Filled 2021-10-01: qty 6.7

## 2021-10-01 MED ORDER — IPRATROPIUM-ALBUTEROL 0.5-2.5 (3) MG/3ML IN SOLN
3.0000 mL | Freq: Three times a day (TID) | RESPIRATORY_TRACT | Status: DC
  Administered 2021-10-02: 3 mL via RESPIRATORY_TRACT
  Filled 2021-10-01: qty 3

## 2021-10-01 MED ORDER — ONDANSETRON HCL 4 MG PO TABS: 4.0000 mg | ORAL_TABLET | Freq: Four times a day (QID) | ORAL | Status: DC | PRN

## 2021-10-01 MED ORDER — METHYLPREDNISOLONE SODIUM SUCC 40 MG IJ SOLR
40.0000 mg | Freq: Two times a day (BID) | INTRAMUSCULAR | Status: DC
  Filled 2021-10-01: qty 1

## 2021-10-01 MED ORDER — PREDNISONE 20 MG PO TABS: 40.0000 mg | ORAL_TABLET | Freq: Every day | ORAL | Status: DC

## 2021-10-01 NOTE — ED Provider Notes (Signed)
[facility] EMERGENCY DEPARTMENT Provider Note   CSN: [id number] Arrival date & time: 10/01/21  1524     History Chief Complaint  Patient presents with   Cough    Cynthia [name] is a 65 y.o. female.  She has a history of hypertension and smokes, COPD.  She is complaining of 10 days of cough productive of white sputum and chest congestion after she received her flu vaccine.  She does not endorse any fevers.  No chest pain abdominal pain vomiting diarrhea.  No headache or body aches.  She has not been COVID vaccinated.  Using Mucinex without much improvement  The history is provided by the patient.  Cough Cough characteristics:  Productive Sputum characteristics:  White Severity:  Moderate Onset quality:  Gradual Duration:  10 days Timing:  Constant Progression:  Unchanged Chronicity:  New Smoker: yes   Context: not sick contacts   Relieved by:  Nothing Worsened by:  Activity Ineffective treatments:  None tried Associated symptoms: rhinorrhea, shortness of breath and wheezing   Associated symptoms: no chest pain, no diaphoresis, no fever, no headaches, no myalgias, no rash and no sore throat       Past Medical History:  Diagnosis Date   Elevated triglycerides with high cholesterol    Hypertension    S/P tooth extraction Feb 2014   for dentures   Tobacco use     Patient Active Problem List   Diagnosis Date Noted   Tobacco use 02/24/2018   Obstructive chronic bronchitis with exacerbation (HCC) 02/24/2018   CAP (community acquired pneumonia) 02/22/2018   Hyponatremia 02/22/2018   Essential hypertension 02/22/2018   Transaminitis 02/22/2018   Reactive airway disease with wheezing 02/22/2018   Atypical lymphocytes present on peripheral blood smear 02/22/2018   Essential hypertension 02/22/2018    Past Surgical History:  Procedure Laterality Date   ABDOMINAL HYSTERECTOMY     2 partial     OB History   No obstetric history on file.     Family History   Problem Relation Age of Onset   Diabetes Mother    Stroke Maternal Grandmother     Social History   Tobacco Use   Smoking status: Every Day    Packs/day: 1.00    Years: 30.00    Pack years: 30.00    Types: Cigarettes   Smokeless tobacco: Never  Vaping Use   Vaping Use: Never used  Substance Use Topics   Alcohol use: No   Drug use: No    Home Medications Prior to Admission medications   Medication Sig Start Date End Date Taking? Authorizing Provider  aspirin EC 81 MG tablet Take 81 mg by mouth daily.    [provider]  fenofibrate (TRICOR) 145 MG tablet Take 145 mg by mouth daily.    [provider]  olmesartan-hydrochlorothiazide (BENICAR HCT) 40-25 MG tablet Take 1 tablet by mouth daily. 06/29/21   [provider]    Allergies    Patient has no known allergies.  Review of Systems   Review of Systems  Constitutional:  Negative for diaphoresis and fever.  HENT:  Positive for rhinorrhea. Negative for sore throat.   Eyes:  Negative for visual disturbance.  Respiratory:  Positive for cough, shortness of breath and wheezing.   Cardiovascular:  Negative for chest pain.  Gastrointestinal:  Negative for abdominal pain.  Genitourinary:  Negative for dysuria.  Musculoskeletal:  Negative for back pain and myalgias.  Skin:  Negative for rash.  Neurological:  Negative for headaches.   Physical Exam Updated Vital Signs BP 128/79 (BP Location: Left Arm)   Pulse 70   Temp 98 F (36.7 C) (Oral)   Resp 19   Ht 5\' 6"  (1.676 m)   Wt 92.1 kg   SpO2 91%   BMI 32.77 kg/m   Physical Exam Vitals and nursing note reviewed.  Constitutional:      General: She is not in acute distress.    Appearance: Normal appearance. She is well-developed.  HENT:     Head: Normocephalic and atraumatic.  Eyes:     Conjunctiva/sclera: Conjunctivae normal.  Cardiovascular:     Rate and Rhythm: Normal rate and regular rhythm.     Heart sounds: No murmur  heard. Pulmonary:     Effort: Pulmonary effort is normal. No respiratory distress.     Breath sounds: Wheezing and rhonchi present.  Abdominal:     Palpations: Abdomen is soft.     Tenderness: There is no abdominal tenderness. There is no guarding or rebound.  Musculoskeletal:        General: No tenderness. Normal range of motion.     Cervical back: Neck supple.     Right lower leg: Edema present.     Left lower leg: Edema present.  Skin:    General: Skin is warm and dry.  Neurological:     General: No focal deficit present.     Mental Status: She is alert.    ED Results / Procedures / Treatments   Labs (all labs ordered are listed, but only abnormal results are displayed) Labs Reviewed  BASIC METABOLIC PANEL - Abnormal; Notable for the following components:      Result Value   Sodium 115 (*)    Chloride 79 (*)    Calcium 8.5 (*)    All other components within normal limits  CBC WITH DIFFERENTIAL/PLATELET - Abnormal; Notable for the following components:   WBC 13.8 (*)    Neutro Abs 9.8 (*)    Abs Immature Granulocytes 0.20 (*)    All other components within normal limits  URINALYSIS, ROUTINE W REFLEX MICROSCOPIC - Abnormal; Notable for the following components:   APPearance HAZY (*)    Hgb urine dipstick SMALL (*)    Leukocytes,Ua MODERATE (*)    Bacteria, UA RARE (*)    All other components within normal limits  BASIC METABOLIC PANEL - Abnormal; Notable for the following components:   Sodium 115 (*)    Chloride 80 (*)    Glucose, Bld 131 (*)    Calcium 8.1 (*)    All other components within normal limits  BASIC METABOLIC PANEL - Abnormal; Notable for the following components:   Sodium 116 (*)    Chloride 81 (*)    Glucose, Bld 139 (*)    Calcium 8.2 (*)    All other components within normal limits  CBC - Abnormal; Notable for the following components:   WBC 14.2 (*)    All other components within normal limits  RESP PANEL BY RT-PCR (FLU A&B, COVID) [id number]   BRAIN NATRIURETIC PEPTIDE  CREATININE, URINE, RANDOM  SODIUM, URINE, RANDOM  PROCALCITONIN  PROCALCITONIN  STREP PNEUMONIAE URINARY ANTIGEN  TSH  OSMOLALITY, URINE  LEGIONELLA PNEUMOPHILA SEROGP 1 UR AG  OSMOLALITY    EKG EKG Interpretation  Date/Time:  Sunday October 01 2021 16:02:26 EDT Ventricular Rate:  63 PR Interval:  143 QRS Duration: 93 QT Interval:  404 QTC Calculation: 414 R Axis:  73 Text Interpretation: Sinus rhythm Supraventricular bigeminy Probable left atrial enlargement No significant change since prior 3/19 Confirmed by Meridee Score [id number]) on 10/01/2021 4:38:30 PM  Radiology DG Chest Port 1 View  Result Date: 10/01/2021 CLINICAL DATA:  Cough and chest congestion. EXAM: PORTABLE CHEST 1 VIEW COMPARISON:  Radiograph 12/21/2019, remote chest CT 10/07/2012 reviewed FINDINGS: The cardiomediastinal contours are normal. There is slight interstitial coarsening that appears chronic. Subsegmental atelectasis at the left lung base. No confluent airspace disease. Pulmonary vasculature is normal. No pleural effusion or pneumothorax. No acute osseous abnormalities are seen. IMPRESSION: Subsegmental atelectasis at the left lung base. Chronic interstitial coarsening likely related smoking. Electronically Signed   By: Narda Rutherford M.D.   On: 10/01/2021 17:39    Procedures Procedures   Medications Ordered in ED Medications  0.9 %  sodium chloride infusion ( Intravenous New Bag/Given 10/02/21 0754)  aspirin EC tablet 81 mg (has no administration in time range)  fenofibrate tablet 160 mg (has no administration in time range)  irbesartan (AVAPRO) tablet 300 mg (has no administration in time range)  cefTRIAXone (ROCEPHIN) 2 g in sodium chloride 0.9 % 100 mL IVPB (has no administration in time range)  azithromycin (ZITHROMAX) 500 mg in sodium chloride 0.9 % 250 mL IVPB (has no administration in time range)  methylPREDNISolone sodium succinate (SOLU-MEDROL) 40 mg/mL  injection 40 mg (has no administration in time range)    Followed by  predniSONE (DELTASONE) tablet 40 mg (has no administration in time range)  albuterol (PROVENTIL) (2.5 MG/3ML) 0.083% nebulizer solution 2.5 mg (has no administration in time range)  enoxaparin (LOVENOX) injection 40 mg (40 mg Subcutaneous Patient Refused/Not Given 10/01/21 2307)  ondansetron (ZOFRAN) tablet 4 mg (has no administration in time range)    Or  ondansetron (ZOFRAN) injection 4 mg (has no administration in time range)  ipratropium-albuterol (DUONEB) 0.5-2.5 (3) MG/3ML nebulizer solution 3 mL (has no administration in time range)  albuterol (VENTOLIN HFA) 108 (90 Base) MCG/ACT inhaler 4 puff (4 puffs Inhalation Given 10/01/21 1653)  methylPREDNISolone sodium succinate (SOLU-MEDROL) 125 mg/2 mL injection 125 mg (125 mg Intravenous Given 10/01/21 1727)  sodium chloride 0.9 % bolus 500 mL (500 mLs Intravenous New Bag/Given 10/01/21 1840)  cefTRIAXone (ROCEPHIN) 1 g in sodium chloride 0.9 % 100 mL IVPB (0 g Intravenous Stopped 10/01/21 1931)  azithromycin (ZITHROMAX) 500 mg in sodium chloride 0.9 % 250 mL IVPB (500 mg Intravenous New Bag/Given 10/01/21 1936)    ED Course  I have reviewed the triage vital signs and the nursing notes.  Pertinent labs & imaging results that were available during my care of the patient were reviewed by me and considered in my medical decision making (see chart for details).  Clinical Course as of 10/02/21 2536  Cynthia [name] Oct 01, 2021  1726 Chest x-ray ordered and interpreted by me as possible left lower lobe atelectasis versus infiltrate.  Pending radiology reading. [MB]  1727 I was informed that patient's chemistry is critically low at with a sodium of 115. [MB]  1842 Reviewed labs with patient.  She said she had low sodium before when she had pneumonia.  She is agreeable to stay for an overnight.  Discussed with Dr. Adrian Blackwater [facility] hospitalist who will evaluate the patient for admission he  asked if we could start her on some antibiotics. [MB]    Clinical Course User Index [MB] Terrilee Files, MD   MDM Rules/Calculators/A&P  AURIE HARROUN was evaluated in Emergency Department on 10/01/2021 for the symptoms described in the history of present illness. She was evaluated in the context of the global COVID-19 pandemic, which necessitated consideration that the patient might be at risk for infection with the SARS-CoV-2 virus that causes COVID-19. Institutional protocols and algorithms that pertain to the evaluation of patients at risk for COVID-19 are in a state of rapid change based on information released by regulatory bodies including the CDC and federal and state organizations. These policies and algorithms were followed during the patient's care in the ED.  This patient complains of cough and malaise; this involves an extensive number of treatment Options and is a complaint that carries with it a high risk of complications and Morbidity. The differential includes pneumonia, COVID, COPD exacerbation, metabolic derangement, pneumothorax, CHF  I ordered, reviewed and interpreted labs, which included CBC with elevated white count normal hemoglobin, chemistries with critically low sodium, elevated glucose, urinalysis without clear signs of infection, COVID and flu negative, BNP normal I ordered medication IV fluids IV antibiotics steroids and breathing treatment I ordered imaging studies which included chest x-ray and I independently    visualized and interpreted imaging which showed left lower lobe atelectasis Additional history obtained from patient's husband Previous records obtained and reviewed in epic, did have prior hyponatremia during admission for pneumonia I consulted [facility] hospitalist Dr.Stinson And discussed lab and imaging findings  Critical Interventions: None  After the interventions stated above, I reevaluated the patient and found  patient to be fairly asymptomatic.  She is agreeable to admission to the hospital for further management of her hyponatremia.   Final Clinical Impression(s) / ED Diagnoses Final diagnoses:  Acute bronchitis, unspecified organism  Hyponatremia    Rx / DC Orders ED Discharge Orders     None        Terrilee Files, MD 10/02/21 1007

## 2021-10-01 NOTE — Progress Notes (Signed)
New admit. Skin assessessment and vital checked pt A&O4. Will continue to monitor. Awaiting orders.

## 2021-10-01 NOTE — ED Notes (Signed)
Patient given food tray with multiple drinks.  Patient says she doesn't like hospital food and refuses to eat it.

## 2021-10-01 NOTE — ED Triage Notes (Signed)
Chest congestion x 1 week

## 2021-10-01 NOTE — ED Notes (Signed)
Critical Lab: Sodium 115.  Provider made aware.

## 2021-10-01 NOTE — H&P (Signed)
History and Physical  ANALLELY [name] CHY:[id number] DOB: 1956/01/12 DOA: 10/01/2021  Referring physician: Dr Rayfield Citizen, ED physician PCP: Assunta Found, MD  Outpatient Specialists:   Patient Coming From: home  Chief Complaint: Cough, shortness of breath  HPI: Cynthia [name] is a 65 y.o. female with a history of COPD, hypertension, current tobacco user.  Patient seen for productive cough, chest congestion over the past 10 days that has been worsening.  Denies fevers, chills, nausea, vomiting.  No palliating provoking factors.  Her cough is pretty consistent.  She has been using Mucinex without too much improvement.  She did not receive a flu vaccine recently.  Of note, patient has a history of hyponatremia 3 years ago.  Patient was taking hydrochlorothiazide at the time and the hyponatremia was attributed to the diuretic use.  Diuretics were stopped and the patient sodium level.  Recently, the patient was put back on the hydrochlorothiazide.  Emergency Department Course: Sodium 115.  Chest x-ray shows atelectasis in the left base.  White count 13  Review of Systems:   Pt denies any fevers, chills, nausea, vomiting, diarrhea, constipation, abdominal pain, palpitations, headache, vision changes, lightheadedness, dizziness, melena, rectal bleeding.  Review of systems are otherwise negative  Past Medical History:  Diagnosis Date   Elevated triglycerides with high cholesterol    Hypertension    S/P tooth extraction Feb 2014   for dentures   Tobacco use    Past Surgical History:  Procedure Laterality Date   ABDOMINAL HYSTERECTOMY     2 partial   Social History:  reports that she has been smoking cigarettes. She has a 30.00 pack-year smoking history. She has never used smokeless tobacco. She reports that she does not drink alcohol and does not use drugs. Patient lives at home  No Known Allergies  Family History  Problem Relation Age of Onset   Diabetes Mother    Stroke Maternal  Grandmother       Prior to Admission medications   Medication Sig Start Date End Date Taking? Authorizing Provider  aspirin EC 81 MG tablet Take 81 mg by mouth daily.   Yes [provider]  fenofibrate (TRICOR) 145 MG tablet Take 145 mg by mouth daily.   Yes [provider]  olmesartan-hydrochlorothiazide (BENICAR HCT) 40-25 MG tablet Take 1 tablet by mouth daily. 06/29/21  Yes [provider]  polyethylene glycol-electrolytes (NULYTELY) 420 g solution Take 4,000 mLs by mouth once. Patient not taking: Reported on 10/01/2021 09/04/21   [provider]    Physical Exam: BP (!) 146/97 (BP Location: Left Arm)   Pulse 65   Temp 98 F (36.7 C) (Oral)   Resp 19   Ht 5\' 6"  (1.676 m)   Wt 92.1 kg   SpO2 94%   BMI 32.77 kg/m   General: Elderly female. Awake and alert and oriented x3. No acute cardiopulmonary distress.  HEENT: Normocephalic atraumatic.  Right and left ears normal in appearance.  Pupils equal, round, reactive to light. Extraocular muscles are intact. Sclerae anicteric and noninjected.  Moist mucosal membranes. No mucosal lesions.  Neck: Neck supple without lymphadenopathy. No carotid bruits. No masses palpated.  Cardiovascular: Regular rate with normal S1-S2 sounds. No murmurs, rubs, gallops auscultated. No JVD.  Respiratory: Rales and wheezing in the base on the left.  Wheezes heard diffusely in the upper lung fields.  No accessory muscle use. Abdomen: Soft, nontender, nondistended. Active bowel sounds. No masses or hepatosplenomegaly  Skin: No rashes, lesions, or ulcerations.  Dry, warm to touch. 2+ dorsalis pedis and radial pulses. Musculoskeletal: No calf or leg pain. All major joints not erythematous nontender.  No upper or lower joint deformation.  Good ROM.  No contractures  Psychiatric: Intact judgment and insight. Pleasant and cooperative. Neurologic: No focal neurological deficits. Strength is 5/5 and symmetric in upper and lower  extremities.  Cranial nerves II through XII are grossly intact.           Labs on Admission: I have personally reviewed following labs and imaging studies  CBC: Recent Labs  Lab 10/01/21 1650  WBC 13.8*  NEUTROABS 9.8*  HGB 14.0  HCT 39.8  MCV 90.0  PLT 279   Basic Metabolic Panel: Recent Labs  Lab 10/01/21 1650  NA 115*  K 4.7  CL 79*  CO2 28  GLUCOSE 99  BUN 19  CREATININE 0.66  CALCIUM 8.5*   GFR: Estimated Creatinine Clearance: 81.2 mL/min (by C-G formula based on SCr of 0.66 mg/dL). Liver Function Tests: No results for input(s): AST, ALT, ALKPHOS, BILITOT, PROT, ALBUMIN in the last 168 hours. No results for input(s): LIPASE, AMYLASE in the last 168 hours. No results for input(s): AMMONIA in the last 168 hours. Coagulation Profile: No results for input(s): INR, PROTIME in the last 168 hours. Cardiac Enzymes: No results for input(s): CKTOTAL, CKMB, CKMBINDEX, TROPONINI in the last 168 hours. BNP (last 3 results) No results for input(s): PROBNP in the last 8760 hours. HbA1C: No results for input(s): HGBA1C in the last 72 hours. CBG: No results for input(s): GLUCAP in the last 168 hours. Lipid Profile: No results for input(s): CHOL, HDL, LDLCALC, TRIG, CHOLHDL, LDLDIRECT in the last 72 hours. Thyroid Function Tests: No results for input(s): TSH, T4TOTAL, FREET4, T3FREE, THYROIDAB in the last 72 hours. Anemia Panel: No results for input(s): VITAMINB12, FOLATE, FERRITIN, TIBC, IRON, RETICCTPCT in the last 72 hours. Urine analysis:    Component Value Date/Time   COLORURINE YELLOW 10/01/2021 1806   APPEARANCEUR HAZY (A) 10/01/2021 1806   LABSPEC 1.010 10/01/2021 1806   PHURINE 6.0 10/01/2021 1806   GLUCOSEU NEGATIVE 10/01/2021 1806   HGBUR SMALL (A) 10/01/2021 1806   BILIRUBINUR NEGATIVE 10/01/2021 1806   KETONESUR NEGATIVE 10/01/2021 1806   PROTEINUR NEGATIVE 10/01/2021 1806   NITRITE NEGATIVE 10/01/2021 1806   LEUKOCYTESUR MODERATE (A) 10/01/2021 1806    Sepsis Labs: @LABRCNTIP (procalcitonin:4,lacticidven:4) ) Recent Results (from the past 240 hour(s))  Resp Panel by RT-PCR (Flu A&B, Covid) Nasopharyngeal Swab     Status: None   Collection Time: 10/01/21  5:22 PM   Specimen: Nasopharyngeal Swab; Nasopharyngeal(NP) swabs in vial transport medium  Result Value Ref Range Status   SARS Coronavirus 2 by RT PCR NEGATIVE NEGATIVE Final    Comment: (NOTE) SARS-CoV-2 target nucleic acids are NOT DETECTED.  The SARS-CoV-2 RNA is generally detectable in upper respiratory specimens during the acute phase of infection. The lowest concentration of SARS-CoV-2 viral copies this assay can detect is 138 copies/mL. A negative result does not preclude SARS-Cov-2 infection and should not be used as the sole basis for treatment or other patient management decisions. A negative result may occur with  improper specimen collection/handling, submission of specimen other than nasopharyngeal swab, presence of viral mutation(s) within the areas targeted by this assay, and inadequate number of viral copies(<138 copies/mL). A negative result must be combined with clinical observations, patient history, and epidemiological information. The expected result is Negative.  Fact Sheet for Patients:  10/03/21  Fact Sheet for Healthcare Providers:  SeriousBroker.it  This test is no t yet approved or cleared by the [place] and  has been authorized for detection and/or diagnosis of SARS-CoV-2 by FDA under an Emergency Use Authorization (EUA). This EUA will remain  in effect (meaning this test can be used) for the duration of the COVID-19 declaration under Section 564(b)(1) of the Act, 21 U.S.C.section 360bbb-3(b)(1), unless the authorization is terminated  or revoked sooner.       Influenza A by PCR NEGATIVE NEGATIVE Final   Influenza B by PCR NEGATIVE NEGATIVE Final    Comment: (NOTE) The  Xpert Xpress SARS-CoV-2/FLU/RSV plus assay is intended as an aid in the diagnosis of influenza from Nasopharyngeal swab specimens and should not be used as a sole basis for treatment. Nasal washings and aspirates are unacceptable for Xpert Xpress SARS-CoV-2/FLU/RSV testing.  Fact Sheet for Patients: BloggerCourse.com  Fact Sheet for Healthcare Providers: SeriousBroker.it  This test is not yet approved or cleared by the [place] FDA and has been authorized for detection and/or diagnosis of SARS-CoV-2 by FDA under an Emergency Use Authorization (EUA). This EUA will remain in effect (meaning this test can be used) for the duration of the COVID-19 declaration under Section 564(b)(1) of the Act, 21 U.S.C. section 360bbb-3(b)(1), unless the authorization is terminated or revoked.  Performed at [facility], [address], [place], [place] [address]      Radiological Exams on Admission: DG Chest Port 1 View  Result Date: 10/01/2021 CLINICAL DATA:  Cough and chest congestion. EXAM: PORTABLE CHEST 1 VIEW COMPARISON:  Radiograph 12/21/2019, remote chest CT 10/07/2012 reviewed FINDINGS: The cardiomediastinal contours are normal. There is slight interstitial coarsening that appears chronic. Subsegmental atelectasis at the left lung base. No confluent airspace disease. Pulmonary vasculature is normal. No pleural effusion or pneumothorax. No acute osseous abnormalities are seen. IMPRESSION: Subsegmental atelectasis at the left lung base. Chronic interstitial coarsening likely related smoking. Electronically Signed   By: Narda Rutherford M.D.   On: 10/01/2021 17:39    EKG: Independently reviewed.  Sinus rhythm with bigeminy.  Left atrial enlargement.  No acute ST changes.  Assessment/Plan: Principal Problem:   Acute hyponatremia Active Problems:   CAP (community acquired pneumonia)   Essential hypertension   COPD with acute exacerbation  (HCC)    This patient was discussed with the ED physician, including pertinent vitals, physical exam findings, labs, and imaging.  We also discussed care given by the ED provider.  Acute hyponatremia Admit Likely secondary to diuretic use Fluid restrict Will give normal saline Recheck BMP later this evening and tomorrow morning. Urine studies with urine sodium, urine osmolality pending COPD with acute exacerbation and possible community-acquired pneumonia Will cover for pneumonia Antibiotics: Rocephin, azithromycin DuoNeb's every 6 scheduled with albuterol every 2 when necessary Continue inhaled steroids and LA bronchodilator Solu-Medrol 60 mg IV every 12 hours Mucinex Influenza negative, COVID-negative Hypertension Hold HCTZ Continue benicar  DVT prophylaxis: lovenox Consultants: none Code Status: full Family Communication:   Disposition Plan: discharge to home following sodium improvement   Levie Heritage, DO

## 2021-10-02 DIAGNOSIS — E871 Hypo-osmolality and hyponatremia: Secondary | ICD-10-CM | POA: Diagnosis not present

## 2021-10-02 DIAGNOSIS — J441 Chronic obstructive pulmonary disease with (acute) exacerbation: Secondary | ICD-10-CM | POA: Diagnosis not present

## 2021-10-02 DIAGNOSIS — J189 Pneumonia, unspecified organism: Secondary | ICD-10-CM | POA: Diagnosis not present

## 2021-10-02 DIAGNOSIS — Z20822 Contact with and (suspected) exposure to covid-19: Secondary | ICD-10-CM | POA: Diagnosis not present

## 2021-10-02 LAB — BASIC METABOLIC PANEL
Anion gap: 10 (ref 5–15)
BUN: 17 mg/dL (ref 8–23)
CO2: 25 mmol/L (ref 22–32)
Calcium: 8.2 mg/dL — ABNORMAL LOW (ref 8.9–10.3)
Chloride: 81 mmol/L — ABNORMAL LOW (ref 98–111)
Creatinine, Ser: 0.64 mg/dL (ref 0.44–1.00)
GFR, Estimated: >60 mL/min (ref >60)
Glucose, Bld: 139 mg/dL — ABNORMAL HIGH (ref 70–99)
Potassium: 4.4 mmol/L (ref 3.5–5.1)
Sodium: 116 mmol/L — CL (ref 135–145)

## 2021-10-02 LAB — CBC
HCT: 40.8 % (ref 36.0–46.0)
Hemoglobin: 14.4 g/dL (ref 12.0–15.0)
MCH: 31.6 pg (ref 26.0–34.0)
MCHC: 35.3 g/dL (ref 30.0–36.0)
MCV: 89.5 fL (ref 80.0–100.0)
Platelets: 305 10*3/uL (ref 150–400)
RBC: 4.56 MIL/uL (ref 3.87–5.11)
RDW: 11.9 % (ref 11.5–15.5)
WBC: 14.2 10*3/uL — ABNORMAL HIGH (ref 4.0–10.5)
nRBC: 0 % (ref 0.0–0.2)

## 2021-10-02 LAB — OSMOLALITY: Osmolality: 251 mOsm/kg — ABNORMAL LOW (ref 275–295)

## 2021-10-02 LAB — TSH: TSH: 1.538 u[IU]/mL (ref 0.350–4.500)

## 2021-10-02 LAB — PROCALCITONIN
Procalcitonin: <0.1 ng/mL
Procalcitonin: <0.1 ng/mL

## 2021-10-02 LAB — OSMOLALITY, URINE: Osmolality, Ur: 290 mOsm/kg — ABNORMAL LOW (ref 300–900)

## 2021-10-02 LAB — STREP PNEUMONIAE URINARY ANTIGEN: Strep Pneumo Urinary Antigen: NEGATIVE

## 2021-10-02 MED ORDER — IPRATROPIUM-ALBUTEROL 0.5-2.5 (3) MG/3ML IN SOLN: 3.0000 mL | Freq: Two times a day (BID) | RESPIRATORY_TRACT | Status: DC

## 2021-10-02 NOTE — Discharge Summary (Signed)
Physician Discharge Summary  LALANYA RUFENER VFI:[id number] DOB: [birth year] DOA: 10/01/2021  PCP: Assunta Found, MD  Admit date: 10/01/2021  Discharge date: 10/02/2021  Admitted From:Home  Disposition:  AMA DISCHARGE  Discharge Condition:Stable  CODE STATUS: Full  Brief/Interim Summary:  Cynthia [name] is a 65 y.o. female with a history of COPD, hypertension, current tobacco user.  Patient seen for productive cough, chest congestion over the past 10 days that has been worsening.  She had been admitted with acute COPD exacerbation with possible community-acquired pneumonia as well as acute hyponatremia that appeared to be asymptomatic.  The hyponatremia was related to her hydrochlorothiazide use which was discontinued once again.  She had further work-up pending and was on normal saline as well as antibiotics, breathing treatments, and steroids with her COPD exacerbation.  Unfortunately, she did not want to stay any longer and understood the risks and benefits of leaving the hospital.  She has decided to leave AGAINST MEDICAL ADVICE this morning.  Notably, she no longer appears symptomatic from her COPD exacerbation.  I have evaluated the medical decision-making capacity of this patient at bedside and have determined that she is definitely/probably capable based on the following criteria:  1) Is able to understand the active medical problem 2) Is able to understand the proposed treatment plan 3) Is able to understand alternatives to the proposed treatment plan 4) Is able to understand the option of refusing the proposed treatment plan 5) Is able to appreciate the foreseeable consequences of accepting and refusing the proposed treatment plan  Additionally, the patient's decision does not appear to influenced by any sign of depression or delusion/psychosis.   Time taken to administer capacity evaluation: 15 mins  Surrogate decision maker:no  Discharge Diagnoses:  Principal  Problem:   Acute hyponatremia Active Problems:   CAP (community acquired pneumonia)   Essential hypertension   Tobacco abuse   COPD with acute exacerbation (HCC)    No Known Allergies  Consultations: None   Procedures/Studies: DG Chest Port 1 View  Result Date: 10/01/2021 CLINICAL DATA:  Cough and chest congestion. EXAM: PORTABLE CHEST 1 VIEW COMPARISON:  Radiograph 12/21/2019, remote chest CT 10/07/2012 reviewed FINDINGS: The cardiomediastinal contours are normal. There is slight interstitial coarsening that appears chronic. Subsegmental atelectasis at the left lung base. No confluent airspace disease. Pulmonary vasculature is normal. No pleural effusion or pneumothorax. No acute osseous abnormalities are seen. IMPRESSION: Subsegmental atelectasis at the left lung base. Chronic interstitial coarsening likely related smoking. Electronically Signed   By: Narda Rutherford M.D.   On: 10/01/2021 17:39     Discharge Exam: Vitals:   10/02/21 0355 10/02/21 0720  BP: 139/77   Pulse: 65   Resp: 18   Temp:    SpO2: 91% 93%   Vitals:   10/01/21 2058 10/01/21 2354 10/02/21 0355 10/02/21 0720  BP:  124/72 139/77   Pulse:  63 65   Resp:  18 18   Temp:  98.4 F (36.9 C)    TempSrc:      SpO2: 94% 96% 91% 93%  Weight:      Height:        General: Pt is alert, awake, not in acute distress Cardiovascular: RRR, S1/S2 +, no rubs, no gallops Respiratory: CTA bilaterally, no wheezing, no rhonchi Abdominal: Soft, NT, ND, bowel sounds + Extremities: no edema, no cyanosis    The results of significant diagnostics from this hospitalization (including imaging, microbiology, ancillary and laboratory) are listed below for reference.  Microbiology: Recent Results (from the past 240 hour(s))  Resp Panel by RT-PCR (Flu A&B, Covid) Nasopharyngeal Swab     Status: None   Collection Time: 10/01/21  5:22 PM   Specimen: Nasopharyngeal Swab; Nasopharyngeal(NP) swabs in vial transport medium   Result Value Ref Range Status   SARS Coronavirus 2 by RT PCR NEGATIVE NEGATIVE Final    Comment: (NOTE) SARS-CoV-2 target nucleic acids are NOT DETECTED.  The SARS-CoV-2 RNA is generally detectable in upper respiratory specimens during the acute phase of infection. The lowest concentration of SARS-CoV-2 viral copies this assay can detect is 138 copies/mL. A negative result does not preclude SARS-Cov-2 infection and should not be used as the sole basis for treatment or other patient management decisions. A negative result may occur with  improper specimen collection/handling, submission of specimen other than nasopharyngeal swab, presence of viral mutation(s) within the areas targeted by this assay, and inadequate number of viral copies(<138 copies/mL). A negative result must be combined with clinical observations, patient history, and epidemiological information. The expected result is Negative.  Fact Sheet for Patients:  BloggerCourse.com  Fact Sheet for Healthcare Providers:  SeriousBroker.it  This test is no t yet approved or cleared by the [place] FDA and  has been authorized for detection and/or diagnosis of SARS-CoV-2 by FDA under an Emergency Use Authorization (EUA). This EUA will remain  in effect (meaning this test can be used) for the duration of the COVID-19 declaration under Section 564(b)(1) of the Act, 21 U.S.C.section 360bbb-3(b)(1), unless the authorization is terminated  or revoked sooner.       Influenza A by PCR NEGATIVE NEGATIVE Final   Influenza B by PCR NEGATIVE NEGATIVE Final    Comment: (NOTE) The Xpert Xpress SARS-CoV-2/FLU/RSV plus assay is intended as an aid in the diagnosis of influenza from Nasopharyngeal swab specimens and should not be used as a sole basis for treatment. Nasal washings and aspirates are unacceptable for Xpert Xpress SARS-CoV-2/FLU/RSV testing.  Fact Sheet for  Patients: BloggerCourse.com  Fact Sheet for Healthcare Providers: SeriousBroker.it  This test is not yet approved or cleared by the [place] FDA and has been authorized for detection and/or diagnosis of SARS-CoV-2 by FDA under an Emergency Use Authorization (EUA). This EUA will remain in effect (meaning this test can be used) for the duration of the COVID-19 declaration under Section 564(b)(1) of the Act, 21 U.S.C. section 360bbb-3(b)(1), unless the authorization is terminated or revoked.  Performed at [facility], [address] [place], [place] [address]      Labs: BNP (last 3 results) Recent Labs    10/01/21 1650  BNP 12.0   Basic Metabolic Panel: Recent Labs  Lab 10/01/21 1650 10/01/21 2205 10/02/21 0413  NA 115* 115* 116*  K 4.7 4.3 4.4  CL 79* 80* 81*  CO2 28 26 25   GLUCOSE 99 131* 139*  BUN 19 18 17   CREATININE 0.66 0.67 0.64  CALCIUM 8.5* 8.1* 8.2*   Liver Function Tests: No results for input(s): AST, ALT, ALKPHOS, BILITOT, PROT, ALBUMIN in the last 168 hours. No results for input(s): LIPASE, AMYLASE in the last 168 hours. No results for input(s): AMMONIA in the last 168 hours. CBC: Recent Labs  Lab 10/01/21 1650 10/02/21 0413  WBC 13.8* 14.2*  NEUTROABS 9.8*  --   HGB 14.0 14.4  HCT 39.8 40.8  MCV 90.0 89.5  PLT 279 305   Cardiac Enzymes: No results for input(s): CKTOTAL, CKMB, CKMBINDEX, TROPONINI in the last 168 hours.  BNP: Invalid input(s): POCBNP CBG: No results for input(s): GLUCAP in the last 168 hours. D-Dimer No results for input(s): DDIMER in the last 72 hours. Hgb A1c No results for input(s): HGBA1C in the last 72 hours. Lipid Profile No results for input(s): CHOL, HDL, LDLCALC, TRIG, CHOLHDL, LDLDIRECT in the last 72 hours. Thyroid function studies No results for input(s): TSH, T4TOTAL, T3FREE, THYROIDAB in the last 72 hours.  Invalid input(s): FREET3 Anemia work up No  results for input(s): VITAMINB12, FOLATE, FERRITIN, TIBC, IRON, RETICCTPCT in the last 72 hours. Urinalysis    Component Value Date/Time   COLORURINE YELLOW 10/01/2021 1806   APPEARANCEUR HAZY (A) 10/01/2021 1806   LABSPEC 1.010 10/01/2021 1806   PHURINE 6.0 10/01/2021 1806   GLUCOSEU NEGATIVE 10/01/2021 1806   HGBUR SMALL (A) 10/01/2021 1806   BILIRUBINUR NEGATIVE 10/01/2021 1806   KETONESUR NEGATIVE 10/01/2021 1806   PROTEINUR NEGATIVE 10/01/2021 1806   NITRITE NEGATIVE 10/01/2021 1806   LEUKOCYTESUR MODERATE (A) 10/01/2021 1806   Sepsis Labs Invalid input(s): PROCALCITONIN,  WBC,  LACTICIDVEN Microbiology Recent Results (from the past 240 hour(s))  Resp Panel by RT-PCR (Flu A&B, Covid) Nasopharyngeal Swab     Status: None   Collection Time: 10/01/21  5:22 PM   Specimen: Nasopharyngeal Swab; Nasopharyngeal(NP) swabs in vial transport medium  Result Value Ref Range Status   SARS Coronavirus 2 by RT PCR NEGATIVE NEGATIVE Final    Comment: (NOTE) SARS-CoV-2 target nucleic acids are NOT DETECTED.  The SARS-CoV-2 RNA is generally detectable in upper respiratory specimens during the acute phase of infection. The lowest concentration of SARS-CoV-2 viral copies this assay can detect is 138 copies/mL. A negative result does not preclude SARS-Cov-2 infection and should not be used as the sole basis for treatment or other patient management decisions. A negative result may occur with  improper specimen collection/handling, submission of specimen other than nasopharyngeal swab, presence of viral mutation(s) within the areas targeted by this assay, and inadequate number of viral copies(<138 copies/mL). A negative result must be combined with clinical observations, patient history, and epidemiological information. The expected result is Negative.  Fact Sheet for Patients:  BloggerCourse.com  Fact Sheet for Healthcare Providers:   SeriousBroker.it  This test is no t yet approved or cleared by the [place] FDA and  has been authorized for detection and/or diagnosis of SARS-CoV-2 by FDA under an Emergency Use Authorization (EUA). This EUA will remain  in effect (meaning this test can be used) for the duration of the COVID-19 declaration under Section 564(b)(1) of the Act, 21 U.S.C.section 360bbb-3(b)(1), unless the authorization is terminated  or revoked sooner.       Influenza A by PCR NEGATIVE NEGATIVE Final   Influenza B by PCR NEGATIVE NEGATIVE Final    Comment: (NOTE) The Xpert Xpress SARS-CoV-2/FLU/RSV plus assay is intended as an aid in the diagnosis of influenza from Nasopharyngeal swab specimens and should not be used as a sole basis for treatment. Nasal washings and aspirates are unacceptable for Xpert Xpress SARS-CoV-2/FLU/RSV testing.  Fact Sheet for Patients: BloggerCourse.com  Fact Sheet for Healthcare Providers: SeriousBroker.it  This test is not yet approved or cleared by the [place] FDA and has been authorized for detection and/or diagnosis of SARS-CoV-2 by FDA under an Emergency Use Authorization (EUA). This EUA will remain in effect (meaning this test can be used) for the duration of the COVID-19 declaration under Section 564(b)(1) of the Act, 21 U.S.C. section 360bbb-3(b)(1), unless the authorization is terminated or revoked.  Performed at [facility], [address], [place], [place] [address]      Time coordinating discharge: 35 minutes  SIGNED:   Erick Blinks, DO [facility] 10/02/2021, 9:05 AM  If 7PM-7AM, please contact night-coverage [email or web address]

## 2021-10-02 NOTE — Progress Notes (Signed)
Informed on call of new critical lab level of sodium of 116. Awaiting orders.

## 2021-10-02 NOTE — Progress Notes (Signed)
Patient leaving AMA. AMA paper signed.IV removed. MD made aware.

## 2021-10-03 ENCOUNTER — Emergency Department (HOSPITAL_COMMUNITY): Payer: Federal, State, Local not specified - PPO

## 2021-10-03 ENCOUNTER — Inpatient Hospital Stay (HOSPITAL_COMMUNITY)
Admission: EM | Admit: 2021-10-03 | Discharge: 2021-10-07 | DRG: 190 | Disposition: A | Discharge status: Home / Self Care | Payer: Federal, State, Local not specified - PPO | Attending: Family Medicine | Admitting: Family Medicine

## 2021-10-03 ENCOUNTER — Encounter (HOSPITAL_COMMUNITY): Payer: Self-pay

## 2021-10-03 ENCOUNTER — Other Ambulatory Visit: Payer: Self-pay

## 2021-10-03 DIAGNOSIS — R410 Disorientation, unspecified: Secondary | ICD-10-CM

## 2021-10-03 DIAGNOSIS — E878 Other disorders of electrolyte and fluid balance, not elsewhere classified: Secondary | ICD-10-CM | POA: Diagnosis present

## 2021-10-03 DIAGNOSIS — Z7982 Long term (current) use of aspirin: Secondary | ICD-10-CM | POA: Diagnosis not present

## 2021-10-03 DIAGNOSIS — J9621 Acute and chronic respiratory failure with hypoxia: Secondary | ICD-10-CM | POA: Diagnosis not present

## 2021-10-03 DIAGNOSIS — D72829 Elevated white blood cell count, unspecified: Secondary | ICD-10-CM | POA: Diagnosis not present

## 2021-10-03 DIAGNOSIS — E781 Pure hyperglyceridemia: Secondary | ICD-10-CM | POA: Diagnosis present

## 2021-10-03 DIAGNOSIS — Z79899 Other long term (current) drug therapy: Secondary | ICD-10-CM

## 2021-10-03 DIAGNOSIS — I10 Essential (primary) hypertension: Secondary | ICD-10-CM | POA: Diagnosis not present

## 2021-10-03 DIAGNOSIS — E871 Hypo-osmolality and hyponatremia: Secondary | ICD-10-CM

## 2021-10-03 DIAGNOSIS — Z6834 Body mass index (BMI) 34.0-34.9, adult: Secondary | ICD-10-CM

## 2021-10-03 DIAGNOSIS — F17210 Nicotine dependence, cigarettes, uncomplicated: Secondary | ICD-10-CM | POA: Diagnosis present

## 2021-10-03 DIAGNOSIS — Z833 Family history of diabetes mellitus: Secondary | ICD-10-CM | POA: Diagnosis not present

## 2021-10-03 DIAGNOSIS — E222 Syndrome of inappropriate secretion of antidiuretic hormone: Secondary | ICD-10-CM | POA: Diagnosis present

## 2021-10-03 DIAGNOSIS — Z20822 Contact with and (suspected) exposure to covid-19: Secondary | ICD-10-CM | POA: Diagnosis present

## 2021-10-03 DIAGNOSIS — E7800 Pure hypercholesterolemia, unspecified: Secondary | ICD-10-CM | POA: Diagnosis not present

## 2021-10-03 DIAGNOSIS — J9622 Acute and chronic respiratory failure with hypercapnia: Secondary | ICD-10-CM | POA: Diagnosis not present

## 2021-10-03 DIAGNOSIS — J441 Chronic obstructive pulmonary disease with (acute) exacerbation: Principal | ICD-10-CM | POA: Diagnosis present

## 2021-10-03 DIAGNOSIS — R609 Edema, unspecified: Secondary | ICD-10-CM

## 2021-10-03 DIAGNOSIS — Z823 Family history of stroke: Secondary | ICD-10-CM | POA: Diagnosis not present

## 2021-10-03 DIAGNOSIS — E669 Obesity, unspecified: Secondary | ICD-10-CM | POA: Diagnosis present

## 2021-10-03 DIAGNOSIS — E2740 Unspecified adrenocortical insufficiency: Secondary | ICD-10-CM

## 2021-10-03 DIAGNOSIS — R0602 Shortness of breath: Secondary | ICD-10-CM | POA: Diagnosis not present

## 2021-10-03 DIAGNOSIS — R079 Chest pain, unspecified: Secondary | ICD-10-CM | POA: Diagnosis not present

## 2021-10-03 LAB — CBC
HCT: 40.8 % (ref 36.0–46.0)
Hemoglobin: 14.4 g/dL (ref 12.0–15.0)
MCH: 32 pg (ref 26.0–34.0)
MCHC: 35.3 g/dL (ref 30.0–36.0)
MCV: 90.7 fL (ref 80.0–100.0)
Platelets: 290 10*3/uL (ref 150–400)
RBC: 4.5 MIL/uL (ref 3.87–5.11)
RDW: 12.2 % (ref 11.5–15.5)
WBC: 12.2 10*3/uL — ABNORMAL HIGH (ref 4.0–10.5)
nRBC: 0 % (ref 0.0–0.2)

## 2021-10-03 LAB — BASIC METABOLIC PANEL
Anion gap: 8 (ref 5–15)
BUN: 12 mg/dL (ref 8–23)
CO2: 28 mmol/L (ref 22–32)
Calcium: 8.4 mg/dL — ABNORMAL LOW (ref 8.9–10.3)
Chloride: 82 mmol/L — ABNORMAL LOW (ref 98–111)
Creatinine, Ser: 0.63 mg/dL (ref 0.44–1.00)
GFR, Estimated: >60 mL/min (ref >60)
Glucose, Bld: 101 mg/dL — ABNORMAL HIGH (ref 70–99)
Potassium: 4.2 mmol/L (ref 3.5–5.1)
Sodium: 118 mmol/L — CL (ref 135–145)

## 2021-10-03 MED ORDER — SODIUM CHLORIDE 0.9 % IV BOLUS
1000.0000 mL | Freq: Once | INTRAVENOUS | Status: AC
  Administered 2021-10-03: 1000 mL via INTRAVENOUS

## 2021-10-03 MED ORDER — ENOXAPARIN SODIUM 40 MG/0.4ML IJ SOSY
40.0000 mg | PREFILLED_SYRINGE | INTRAMUSCULAR | Status: DC
  Administered 2021-10-06: 40 mg via SUBCUTANEOUS
  Filled 2021-10-03 (×3): qty 0.4

## 2021-10-03 NOTE — ED Triage Notes (Signed)
Pt here for chest pain, sob, and cough. Pt was admitted to hospital for COPD exacerbation and PNA, pt left AMA, pt says she shouldn't have left and is back for treatment.

## 2021-10-03 NOTE — ED Provider Notes (Addendum)
[facility] EMERGENCY DEPARTMENT Provider Note   CSN: [id number] Arrival date & time: 10/03/21  1936     History Chief Complaint  Patient presents with   Shortness of Breath    Chest pain and cough    Cynthia [name] is a 65 y.o. female.  Patient was admitted to the hospital with hyponatremia and COPD exacerbation possible pneumonia.  She left AMA yesterday.  She returns today because she states she is feels confused  The history is provided by the patient and medical records. No language interpreter was used.  Shortness of Breath Severity:  Mild Onset quality:  Sudden Timing:  Constant Progression:  Waxing and waning Chronicity:  Recurrent Context: activity   Relieved by:  Nothing Worsened by:  Nothing Ineffective treatments:  None tried Associated symptoms: no abdominal pain, no chest pain, no cough, no headaches and no rash   Weakness Associated symptoms: shortness of breath   Associated symptoms: no abdominal pain, no chest pain, no cough, no diarrhea, no frequency, no headaches and no seizures       Past Medical History:  Diagnosis Date   Elevated triglycerides with high cholesterol    Hypertension    S/P tooth extraction Feb 2014   for dentures   Tobacco use     Patient Active Problem List   Diagnosis Date Noted   Acute hyponatremia 10/01/2021   COPD with acute exacerbation (HCC) 10/01/2021   Tobacco abuse 02/24/2018   Obstructive chronic bronchitis with exacerbation (HCC) 02/24/2018   CAP (community acquired pneumonia) 02/22/2018   Hyponatremia 02/22/2018   Essential hypertension 02/22/2018   Transaminitis 02/22/2018   Reactive airway disease with wheezing 02/22/2018   Atypical lymphocytes present on peripheral blood smear 02/22/2018   Essential hypertension 02/22/2018    Past Surgical History:  Procedure Laterality Date   ABDOMINAL HYSTERECTOMY     2 partial     OB History   No obstetric history on file.     Family History  Problem  Relation Age of Onset   Diabetes Mother    Stroke Maternal Grandmother     Social History   Tobacco Use   Smoking status: Every Day    Packs/day: 1.00    Years: 30.00    Pack years: 30.00    Types: Cigarettes   Smokeless tobacco: Never  Vaping Use   Vaping Use: Never used  Substance Use Topics   Alcohol use: No   Drug use: No    Home Medications Prior to Admission medications   Medication Sig Start Date End Date Taking? Authorizing Provider  aspirin EC 81 MG tablet Take 81 mg by mouth daily.    [provider]  fenofibrate (TRICOR) 145 MG tablet Take 145 mg by mouth daily.    [provider]  olmesartan-hydrochlorothiazide (BENICAR HCT) 40-25 MG tablet Take 1 tablet by mouth daily. 06/29/21   [provider]  polyethylene glycol-electrolytes (NULYTELY) 420 g solution Take 4,000 mLs by mouth once. Patient not taking: Reported on 10/01/2021 09/04/21   [provider]    Allergies    Patient has no known allergies.  Review of Systems   Review of Systems  Constitutional:  Negative for appetite change and fatigue.  HENT:  Negative for congestion, ear discharge and sinus pressure.   Eyes:  Negative for discharge.  Respiratory:  Positive for shortness of breath. Negative for cough.   Cardiovascular:  Negative for chest pain.  Gastrointestinal:  Negative for abdominal pain and diarrhea.  Genitourinary:  Negative for frequency and hematuria.  Musculoskeletal:  Negative for back pain.  Skin:  Negative for rash.  Neurological:  Positive for weakness. Negative for seizures and headaches.       Confused  Psychiatric/Behavioral:  Negative for hallucinations.    Physical Exam Updated Vital Signs BP 119/72   Pulse 68   Temp 97.6 F (36.4 C) (Oral)   Resp 14   Ht 5\' 5"  (1.651 m)   Wt 93.1 kg   SpO2 96%   BMI 34.16 kg/m   Physical Exam Vitals and nursing note reviewed.  Constitutional:      Appearance: She is well-developed.  HENT:      Head: Normocephalic.     Nose: Nose normal.  Eyes:     General: No scleral icterus.    Conjunctiva/sclera: Conjunctivae normal.  Neck:     Thyroid: No thyromegaly.  Cardiovascular:     Rate and Rhythm: Normal rate and regular rhythm.     Heart sounds: No murmur heard.   No friction rub. No gallop.  Pulmonary:     Breath sounds: No stridor. No wheezing or rales.  Chest:     Chest wall: No tenderness.  Abdominal:     General: There is no distension.     Tenderness: There is no abdominal tenderness. There is no rebound.  Musculoskeletal:        General: Normal range of motion.     Cervical back: Neck supple.  Lymphadenopathy:     Cervical: No cervical adenopathy.  Skin:    Findings: No erythema or rash.  Neurological:     Mental Status: She is alert and oriented to person, place, and time.     Motor: No abnormal muscle tone.     Coordination: Coordination normal.  Psychiatric:        Behavior: Behavior normal.    ED Results / Procedures / Treatments   Labs (all labs ordered are listed, but only abnormal results are displayed) Labs Reviewed  BASIC METABOLIC PANEL - Abnormal; Notable for the following components:      Result Value   Sodium 118 (*)    Chloride 82 (*)    Glucose, Bld 101 (*)    Calcium 8.4 (*)    All other components within normal limits  CBC - Abnormal; Notable for the following components:   WBC 12.2 (*)    All other components within normal limits    EKG None  Radiology DG Chest 2 View  Result Date: 10/03/2021 CLINICAL DATA:  Chest pain EXAM: CHEST - 2 VIEW COMPARISON:  10/01/2021 FINDINGS: The heart size and mediastinal contours are within normal limits. Chronic interstitial changes. No new consolidation. No pleural effusion or pneumothorax. The visualized skeletal structures are unremarkable. IMPRESSION: No acute process in the chest. Electronically Signed   By: 10/03/2021 M.D.   On: 10/03/2021 20:31    Procedures Procedures    Medications Ordered in ED Medications  sodium chloride 0.9 % bolus 1,000 mL (1,000 mLs Intravenous New Bag/Given 10/03/21 2132)    ED Course  I have reviewed the triage vital signs and the nursing notes.  Pertinent labs & imaging results that were available during my care of the patient were reviewed by me and considered in my medical decision making (see chart for details).   CRITICAL CARE Performed by: [id number] Total critical care time: 40 minutes Critical care time was exclusive of separately billable procedures and treating other patients. Critical care was  necessary to treat or prevent imminent or life-threatening deterioration. Critical care was time spent personally by me on the following activities: development of treatment plan with patient and/or surrogate as well as nursing, discussions with consultants, evaluation of patient's response to treatment, examination of patient, obtaining history from patient or surrogate, ordering and performing treatments and interventions, ordering and review of laboratory studies, ordering and review of radiographic studies, pulse oximetry and re-evaluation of patient's condition.  MDM Rules/Calculators/A&P                           Patient with hyponatremia she will be admitted to medicine Final Clinical Impression(s) / ED Diagnoses Final diagnoses:  Hyponatremia    Rx / DC Orders ED Discharge Orders     None        Bethann Berkshire, MD 10/07/21 1143    Bethann Berkshire, MD 10/07/21 1144

## 2021-10-03 NOTE — H&P (Signed)
History and Physical  Cynthia [name] ZOX:[id number] DOB: 1956-07-24 DOA: 10/03/2021  Referring physician: Bethann Berkshire, MD PCP: Assunta Found, MD  Patient coming from: Home  Chief Complaint: Confusion  HPI: Cynthia [name] is a 65 y.o. female with medical history significant for hypertension, COPD, tobacco abuse who presents to the emergency department due to confusion.  Patient was admitted on 10/23 due to acute hyponatremia and COPD exacerbation with possible  acquired pneumonia.  However, she left AMA yesterday (10/24) AGAINST MEDICAL ADVICE.  She returned today complaining of intermittent confusion, so she thought it may be due to her low sodium level.  She complained of cough which was intermittently productive, the cough was associated with chest pain.  She denies fever, chills, nausea, vomiting, abdominal pain.  ED Course:  In the emergency department, she was hemodynamically stable.  Work-up in the ED showed hyponatremia with sodium of 118, potassium 4.2, chloride 82, CO2 28, glucose 1 1, BUN 12, creatinine 0.63.  CBC was normal except for WBC at 12.2.  Serum osmolality at 251. Chest x-ray showed no acute process in the chest. IV hydration was provided.  Hospitalist was asked to admit patient for further evaluation and management.  Review of Systems: Constitutional: Negative for chills and fever.  HENT: Negative for ear pain and sore throat.   Eyes: Negative for pain and visual disturbance.  Respiratory: Positive for cough and shortness of breath.   Cardiovascular: Negative for chest pain and palpitations.  Gastrointestinal: Negative for abdominal pain and vomiting.  Endocrine: Negative for polyphagia and polyuria.  Genitourinary: Negative for decreased urine volume, dysuria, enuresis Musculoskeletal: Negative for arthralgias and back pain.  Skin: Negative for color change and rash.  Allergic/Immunologic: Negative for immunocompromised state.  Neurological: Positive for  intermittent confusion.  Negative for tremors, syncope, speech difficulty, weakness and headaches.  Hematological: Does not bruise/bleed easily.  All other systems reviewed and are negative   Past Medical History:  Diagnosis Date   Elevated triglycerides with high cholesterol    Hypertension    S/P tooth extraction Feb 2014   for dentures   Tobacco use    Past Surgical History:  Procedure Laterality Date   ABDOMINAL HYSTERECTOMY     2 partial    Social History:  reports that she has been smoking cigarettes. She has a 30.00 pack-year smoking history. She has never used smokeless tobacco. She reports that she does not drink alcohol and does not use drugs.   No Known Allergies  Family History  Problem Relation Age of Onset   Diabetes Mother    Stroke Maternal Grandmother      Prior to Admission medications   Medication Sig Start Date End Date Taking? Authorizing Provider  aspirin EC 81 MG tablet Take 81 mg by mouth daily.    [provider]  fenofibrate (TRICOR) 145 MG tablet Take 145 mg by mouth daily.    [provider]  olmesartan-hydrochlorothiazide (BENICAR HCT) 40-25 MG tablet Take 1 tablet by mouth daily. 06/29/21   [provider]  polyethylene glycol-electrolytes (NULYTELY) 420 g solution Take 4,000 mLs by mouth once. Patient not taking: Reported on 10/01/2021 09/04/21   [provider]    Physical Exam: BP 135/75   Pulse 62   Temp 97.6 F (36.4 C) (Oral)   Resp 20   Ht 5\' 5"  (1.651 m)   Wt 93.1 kg   SpO2 97%   BMI 34.16 kg/m   General: 65 y.o. year-old female well developed  well nourished in no acute distress.  Alert and oriented x3. HEENT: NCAT, EOMI Neck: Supple, trachea medial Cardiovascular: Regular rate and rhythm with no rubs or gallops.  No thyromegaly or JVD noted.  No lower extremity edema. 2/4 pulses in all 4 extremities. Respiratory: Diffuse wheezing on auscultation with no rales.  Abdomen: Soft, nontender  nondistended with normal bowel sounds x4 quadrants. Muskuloskeletal: No cyanosis, clubbing or edema noted bilaterally Neuro: CN II-XII intact, strength 5/5 x 4, sensation, reflexes intact Skin: No ulcerative lesions noted or rashes Psychiatry: Judgement and insight appear normal. Mood is appropriate for condition and setting          Labs on Admission:  Basic Metabolic Panel: Recent Labs  Lab 10/01/21 1650 10/01/21 2205 10/02/21 0413 10/03/21 2007  NA 115* 115* 116* 118*  K 4.7 4.3 4.4 4.2  CL 79* 80* 81* 82*  CO2 28 26 25 28   GLUCOSE 99 131* 139* 101*  BUN 19 18 17 12   CREATININE 0.66 0.67 0.64 0.63  CALCIUM 8.5* 8.1* 8.2* 8.4*   Liver Function Tests: No results for input(s): AST, ALT, ALKPHOS, BILITOT, PROT, ALBUMIN in the last 168 hours. No results for input(s): LIPASE, AMYLASE in the last 168 hours. No results for input(s): AMMONIA in the last 168 hours. CBC: Recent Labs  Lab 10/01/21 1650 10/02/21 0413 10/03/21 2007  WBC 13.8* 14.2* 12.2*  NEUTROABS 9.8*  --   --   HGB 14.0 14.4 14.4  HCT 39.8 40.8 40.8  MCV 90.0 89.5 90.7  PLT 279 305 290   Cardiac Enzymes: No results for input(s): CKTOTAL, CKMB, CKMBINDEX, TROPONINI in the last 168 hours.  BNP (last 3 results) Recent Labs    10/01/21 1650  BNP 12.0    ProBNP (last 3 results) No results for input(s): PROBNP in the last 8760 hours.  CBG: No results for input(s): GLUCAP in the last 168 hours.  Radiological Exams on Admission: DG Chest 2 View  Result Date: 10/03/2021 CLINICAL DATA:  Chest pain EXAM: CHEST - 2 VIEW COMPARISON:  10/01/2021 FINDINGS: The heart size and mediastinal contours are within normal limits. Chronic interstitial changes. No new consolidation. No pleural effusion or pneumothorax. The visualized skeletal structures are unremarkable. IMPRESSION: No acute process in the chest. Electronically Signed   By: 10/05/2021 M.D.   On: 10/03/2021 20:31    EKG: I independently viewed the  EKG done and my findings are as followed: Normal sinus rhythm with PACs at a rate of 63 bpm  Assessment/Plan Present on Admission:  Hyponatremia  Principal Problem:   Hyponatremia Active Problems:   COPD with acute exacerbation (HCC)   Leukocytosis   Obesity   Confusion   Acute on chronic respiratory failure with hypoxia and hypercapnia (HCC)  Symptomatic intermittent confusion possibly secondary to multifactorial Patient endorses confusion which may be due to hyponatremia, but considering she recently left AMA while being treated for COPD and possible pneumonia, ABG was checked and was 48.8. Continue management of hyponatremia as well as COPD.  Hyponatremic hypochloremia Na 118, this may be due to diuretic effect Continue IV hydration and continue to monitor sodium levels  Acute respiratory failure with hypoxia and hypercapnia secondary to acute exacerbation of COPD  Continue duo nebs, Mucinex, Robitussin, Solu-Medrol, azithromycin. Continue Protonix to prevent steroid-induced ulcer Continue incentive spirometry and flutter valve Continue supplemental oxygen to maintain O2 sat > 92% with plan to wean patient off oxygen as tolerated  Leukocytosis possibly reactive WBC 12.2, this may be  due to recent steroid given prior to patient leaving AMA Continue to monitor WBC with morning labs  Obesity (BMI 34.16) Patient was counseled on diet and lifestyle modification.   DVT prophylaxis: Lovenox  Code Status: Full code  Family Communication: None at bedside  Disposition Plan:  Patient is from:                        home Anticipated DC to:                   SNF or family members home Anticipated DC date:               2-3 days Anticipated DC barriers:          Patient requires inpatient management due to COPD requiring oxygen supplementation and confusion secondary to low sodium levels   Consults called: None  Admission status: Observation   Frankey Shown MD [facility]  10/04/2021, 2:30 AM

## 2021-10-04 DIAGNOSIS — J9622 Acute and chronic respiratory failure with hypercapnia: Secondary | ICD-10-CM | POA: Diagnosis present

## 2021-10-04 DIAGNOSIS — E7800 Pure hypercholesterolemia, unspecified: Secondary | ICD-10-CM | POA: Diagnosis present

## 2021-10-04 DIAGNOSIS — Z6834 Body mass index (BMI) 34.0-34.9, adult: Secondary | ICD-10-CM | POA: Diagnosis not present

## 2021-10-04 DIAGNOSIS — Z7982 Long term (current) use of aspirin: Secondary | ICD-10-CM | POA: Diagnosis not present

## 2021-10-04 DIAGNOSIS — R600 Localized edema: Secondary | ICD-10-CM | POA: Diagnosis not present

## 2021-10-04 DIAGNOSIS — Z823 Family history of stroke: Secondary | ICD-10-CM | POA: Diagnosis not present

## 2021-10-04 DIAGNOSIS — F17210 Nicotine dependence, cigarettes, uncomplicated: Secondary | ICD-10-CM | POA: Diagnosis present

## 2021-10-04 DIAGNOSIS — I10 Essential (primary) hypertension: Secondary | ICD-10-CM | POA: Diagnosis present

## 2021-10-04 DIAGNOSIS — J441 Chronic obstructive pulmonary disease with (acute) exacerbation: Secondary | ICD-10-CM | POA: Diagnosis not present

## 2021-10-04 DIAGNOSIS — D72829 Elevated white blood cell count, unspecified: Secondary | ICD-10-CM | POA: Diagnosis not present

## 2021-10-04 DIAGNOSIS — E878 Other disorders of electrolyte and fluid balance, not elsewhere classified: Secondary | ICD-10-CM | POA: Diagnosis present

## 2021-10-04 DIAGNOSIS — E669 Obesity, unspecified: Secondary | ICD-10-CM

## 2021-10-04 DIAGNOSIS — E871 Hypo-osmolality and hyponatremia: Secondary | ICD-10-CM | POA: Diagnosis not present

## 2021-10-04 DIAGNOSIS — J9621 Acute and chronic respiratory failure with hypoxia: Secondary | ICD-10-CM | POA: Diagnosis present

## 2021-10-04 DIAGNOSIS — E222 Syndrome of inappropriate secretion of antidiuretic hormone: Secondary | ICD-10-CM | POA: Diagnosis not present

## 2021-10-04 DIAGNOSIS — E781 Pure hyperglyceridemia: Secondary | ICD-10-CM | POA: Diagnosis present

## 2021-10-04 DIAGNOSIS — Z833 Family history of diabetes mellitus: Secondary | ICD-10-CM | POA: Diagnosis not present

## 2021-10-04 DIAGNOSIS — Z79899 Other long term (current) drug therapy: Secondary | ICD-10-CM | POA: Diagnosis not present

## 2021-10-04 DIAGNOSIS — Z20822 Contact with and (suspected) exposure to covid-19: Secondary | ICD-10-CM | POA: Diagnosis present

## 2021-10-04 DIAGNOSIS — R0602 Shortness of breath: Secondary | ICD-10-CM | POA: Diagnosis present

## 2021-10-04 DIAGNOSIS — R410 Disorientation, unspecified: Secondary | ICD-10-CM

## 2021-10-04 LAB — COMPREHENSIVE METABOLIC PANEL
ALT: 28 U/L (ref 0–44)
AST: 40 U/L (ref 15–41)
Albumin: 3.5 g/dL (ref 3.5–5.0)
Alkaline Phosphatase: 37 U/L — ABNORMAL LOW (ref 38–126)
Anion gap: 6 (ref 5–15)
BUN: 12 mg/dL (ref 8–23)
CO2: 28 mmol/L (ref 22–32)
Calcium: 8.2 mg/dL — ABNORMAL LOW (ref 8.9–10.3)
Chloride: 87 mmol/L — ABNORMAL LOW (ref 98–111)
Creatinine, Ser: 0.68 mg/dL (ref 0.44–1.00)
GFR, Estimated: >60 mL/min (ref >60)
Glucose, Bld: 89 mg/dL (ref 70–99)
Potassium: 4.2 mmol/L (ref 3.5–5.1)
Sodium: 121 mmol/L — ABNORMAL LOW (ref 135–145)
Total Bilirubin: 0.7 mg/dL (ref 0.3–1.2)
Total Protein: 6 g/dL — ABNORMAL LOW (ref 6.5–8.1)

## 2021-10-04 LAB — BASIC METABOLIC PANEL
Anion gap: 7 (ref 5–15)
BUN: 14 mg/dL (ref 8–23)
CO2: 26 mmol/L (ref 22–32)
Calcium: 8.5 mg/dL — ABNORMAL LOW (ref 8.9–10.3)
Chloride: 86 mmol/L — ABNORMAL LOW (ref 98–111)
Creatinine, Ser: 0.68 mg/dL (ref 0.44–1.00)
GFR, Estimated: >60 mL/min (ref >60)
Glucose, Bld: 136 mg/dL — ABNORMAL HIGH (ref 70–99)
Potassium: 4.4 mmol/L (ref 3.5–5.1)
Sodium: 119 mmol/L — CL (ref 135–145)

## 2021-10-04 LAB — CBC
HCT: 38.7 % (ref 36.0–46.0)
Hemoglobin: 13.4 g/dL (ref 12.0–15.0)
MCH: 31.8 pg (ref 26.0–34.0)
MCHC: 34.6 g/dL (ref 30.0–36.0)
MCV: 91.7 fL (ref 80.0–100.0)
Platelets: 265 10*3/uL (ref 150–400)
RBC: 4.22 MIL/uL (ref 3.87–5.11)
RDW: 12.1 % (ref 11.5–15.5)
WBC: 10.7 10*3/uL — ABNORMAL HIGH (ref 4.0–10.5)
nRBC: 0 % (ref 0.0–0.2)

## 2021-10-04 LAB — HIV ANTIBODY (ROUTINE TESTING W REFLEX): HIV Screen 4th Generation wRfx: NONREACTIVE

## 2021-10-04 LAB — LEGIONELLA PNEUMOPHILA SEROGP 1 UR AG: L. pneumophila Serogp 1 Ur Ag: NEGATIVE

## 2021-10-04 LAB — BLOOD GAS, ARTERIAL
Acid-Base Excess: 3.3 mmol/L — ABNORMAL HIGH (ref 0.0–2.0)
Bicarbonate: 26.7 mmol/L (ref 20.0–28.0)
FIO2: 28
O2 Saturation: 96.1 %
Patient temperature: 37
pCO2 arterial: 48.8 mmHg — ABNORMAL HIGH (ref 32.0–48.0)
pH, Arterial: 7.378 (ref 7.350–7.450)
pO2, Arterial: 87 mmHg (ref 83.0–108.0)

## 2021-10-04 LAB — APTT: aPTT: 29 seconds (ref 24–36)

## 2021-10-04 LAB — OSMOLALITY, URINE: Osmolality, Ur: 375 mOsm/kg (ref 300–900)

## 2021-10-04 LAB — SODIUM, URINE, RANDOM: Sodium, Ur: 61 mmol/L

## 2021-10-04 LAB — PROTIME-INR
INR: 1.1 (ref 0.8–1.2)
Prothrombin Time: 13.8 seconds (ref 11.4–15.2)

## 2021-10-04 LAB — MAGNESIUM: Magnesium: 1.9 mg/dL (ref 1.7–2.4)

## 2021-10-04 LAB — PHOSPHORUS: Phosphorus: 2.9 mg/dL (ref 2.5–4.6)

## 2021-10-04 MED ORDER — GUAIFENESIN-DM 100-10 MG/5ML PO SYRP: 5.0000 mL | ORAL_SOLUTION | ORAL | Status: DC | PRN

## 2021-10-04 MED ORDER — PANTOPRAZOLE SODIUM 40 MG PO TBEC
40.0000 mg | DELAYED_RELEASE_TABLET | Freq: Every day | ORAL | Status: DC
  Administered 2021-10-04 – 2021-10-07 (×4): 40 mg via ORAL
  Filled 2021-10-04 (×4): qty 1

## 2021-10-04 MED ORDER — AZITHROMYCIN 250 MG PO TABS
250.0000 mg | ORAL_TABLET | Freq: Every day | ORAL | Status: DC
Start: 2021-10-05 — End: 2021-10-07
  Administered 2021-10-05 – 2021-10-07 (×3): 250 mg via ORAL
  Filled 2021-10-04 (×3): qty 1

## 2021-10-04 MED ORDER — AZITHROMYCIN 250 MG PO TABS
500.0000 mg | ORAL_TABLET | Freq: Every day | ORAL | Status: AC
  Administered 2021-10-04: 500 mg via ORAL
  Filled 2021-10-04: qty 2

## 2021-10-04 MED ORDER — IPRATROPIUM-ALBUTEROL 0.5-2.5 (3) MG/3ML IN SOLN
3.0000 mL | Freq: Two times a day (BID) | RESPIRATORY_TRACT | Status: DC
  Administered 2021-10-05 (×2): 3 mL via RESPIRATORY_TRACT
  Filled 2021-10-04: qty 3

## 2021-10-04 MED ORDER — IPRATROPIUM-ALBUTEROL 0.5-2.5 (3) MG/3ML IN SOLN
3.0000 mL | Freq: Four times a day (QID) | RESPIRATORY_TRACT | Status: DC
  Administered 2021-10-04 (×3): 3 mL via RESPIRATORY_TRACT
  Filled 2021-10-04 (×2): qty 3

## 2021-10-04 MED ORDER — DM-GUAIFENESIN ER 30-600 MG PO TB12
1.0000 | ORAL_TABLET | Freq: Two times a day (BID) | ORAL | Status: DC
  Administered 2021-10-04 – 2021-10-07 (×7): 1 via ORAL
  Filled 2021-10-04 (×7): qty 1

## 2021-10-04 MED ORDER — METHYLPREDNISOLONE SODIUM SUCC 40 MG IJ SOLR
40.0000 mg | Freq: Two times a day (BID) | INTRAMUSCULAR | Status: DC
  Administered 2021-10-04 – 2021-10-05 (×4): 40 mg via INTRAVENOUS
  Filled 2021-10-04 (×4): qty 1

## 2021-10-04 MED ORDER — SODIUM CHLORIDE 0.9 % IV SOLN: Freq: Once | INTRAVENOUS | Status: AC

## 2021-10-04 MED ORDER — IPRATROPIUM-ALBUTEROL 0.5-2.5 (3) MG/3ML IN SOLN: 3.0000 mL | RESPIRATORY_TRACT | Status: DC | PRN

## 2021-10-04 NOTE — ED Notes (Signed)
Attempted to call report, nurse said she would call back.

## 2021-10-04 NOTE — ED Notes (Signed)
Report given to [facility] on 300.

## 2021-10-04 NOTE — Progress Notes (Signed)
PROGRESS NOTE    Cynthia [name]  DJM:[id number] DOB: 26-Oct-1956 DOA: 10/03/2021 PCP: Assunta Found, MD    Chief Complaint  Patient presents with   Shortness of Breath    Chest pain and cough    Brief Narrative:  H/o COPD, HTN, tobacco use was admitted to the hospital on 10/23 due to ongoing cough for 10 days, resend off AMA on 10/24, return to ED on 10/25 due to persistent short of breath cough  Subjective:  Persistent hyponatremia, denies headache, report has history of hyponatremia She is oriented x3 this morning Continue have cough, on 2 L oxygen  Assessment & Plan:   Principal Problem:   Hyponatremia Active Problems:   COPD with acute exacerbation (HCC)   Leukocytosis   Obesity   Confusion   Acute on chronic respiratory failure with hypoxia and hypercapnia (HCC)   COPD exacerbation -Tested negative for COVID-19/influenza on 10/23 -Continue IV steroid, DuoNeb, antibiotics -she is on o2 supplement, I donot see any documentation of hypoxia, will monitor   Hyponatremia Appear acute on chronic She does take HCTZ at home, which is held currently Difficult to assess volume status, she does has bilateral lower extremity edema left worse than the right Serum osmolality 251, urine osmolality ordered this morning in process, urine sodium 61 today, it appeared that she is on HCTZ at home also received fluids on admission not sure urine sodium is reliable TSH unremarkable, check a.m. cortisol level Check uric acid Will continue hold HCTZ, hold off ivf, will not order fluids restriction, will repeat urine sodium, urine osmo in am Repeat BMP in the morning, consider nephrology consult in the morning  Bilateral lower extremity pitting edema, left > right Check ddimer and venous doppler  HTN Stable without home meds    obesity: Body mass index is 34.11 kg/m.Marland Kitchen      Unresulted Labs (From admission, onward)     Start     Ordered   10/10/21 0500  Creatinine, serum   (enoxaparin (LOVENOX)    CrCl >/= 30 ml/min)  Weekly,   R     Comments: while on enoxaparin therapy    10/03/21 2233   10/05/21 0500  Uric acid  Tomorrow morning,   R        10/04/21 0913   10/05/21 0500  Basic metabolic panel  Tomorrow morning,   R        10/04/21 0913   10/04/21 1239  Expectorated Sputum Assessment w Gram Stain, Rflx to Resp Cult  Once,   R        10/04/21 1238   10/04/21 0913  Osmolality, urine  Once,   R        10/04/21 0913              DVT prophylaxis: enoxaparin (LOVENOX) injection 40 mg Start: 10/04/21 1000 SCDs Start: 10/03/21 2234   Code Status:full Family Communication: patient  Disposition:   Status is: Inpatient   Dispo: The patient is from: home              Anticipated d/c is to: home              Anticipated d/c date is: TBD, pending on respiratory status, sodium level, venous doppler study                Consultants:  Consider nephrology consult in am  Procedures:  none  Antimicrobials:    Anti-infectives (From admission, onward)    Start  Dose/Rate Route Frequency Ordered Stop   10/05/21 1000  azithromycin (ZITHROMAX) tablet 250 mg       See Hyperspace for full Linked Orders Report.   250 mg Oral Daily 10/04/21 0234 10/09/21 0959   10/04/21 1000  azithromycin (ZITHROMAX) tablet 500 mg       See Hyperspace for full Linked Orders Report.   500 mg Oral Daily 10/04/21 0234 10/04/21 1028          Objective: Vitals:   10/04/21 1100 10/04/21 1307 10/04/21 1430 10/04/21 1620  BP: 124/61  125/69 125/66  Pulse: 80  70 69  Resp: 18  18 20   Temp: 98.3 F (36.8 C)  98.1 F (36.7 C) 98.4 F (36.9 C)  TempSrc: Oral  Oral Oral  SpO2: 97% 94% 93% 96%  Weight: 93 kg     Height: 5\' 5"  (1.651 m)       Intake/Output Summary (Last 24 hours) at 10/04/2021 1824 Last data filed at 10/04/2021 0539 Gross per 24 hour  Intake 240 ml  Output --  Net 240 ml   Filed Weights   10/03/21 1954 10/04/21 1100  Weight: 93.1 kg 93 kg     Examination:  General exam: alert, awake, communicative,calm, NAD, continue to cough Respiratory system: diminished, scattered wheezing,  Respiratory effort normal. Cardiovascular system:  RRR.  Gastrointestinal system: Abdomen is nondistended, soft and nontender.  Normal bowel sounds heard. Central nervous system: Alert and oriented. No focal neurological deficits. Extremities:  bilateral lower extremity pitting edema, appear to have venous stasis changes, left lower extremity edema> right  Skin: No rashes, lesions or ulcers Psychiatry: Judgement and insight appear normal. Mood & affect appropriate.     Data Reviewed: I have personally reviewed following labs and imaging studies  CBC: Recent Labs  Lab 10/01/21 1650 10/02/21 0413 10/03/21 2007 10/04/21 0406  WBC 13.8* 14.2* 12.2* 10.7*  NEUTROABS 9.8*  --   --   --   HGB 14.0 14.4 14.4 13.4  HCT 39.8 40.8 40.8 38.7  MCV 90.0 89.5 90.7 91.7  PLT 279 305 290 265    Basic Metabolic Panel: Recent Labs  Lab 10/01/21 2205 10/02/21 0413 10/03/21 2007 10/04/21 0406 10/04/21 1052  NA 115* 116* 118* 121* 119*  K 4.3 4.4 4.2 4.2 4.4  CL 80* 81* 82* 87* 86*  CO2 26 25 28 28 26   GLUCOSE 131* 139* 101* 89 136*  BUN 18 17 12 12 14   CREATININE 0.67 0.64 0.63 0.68 0.68  CALCIUM 8.1* 8.2* 8.4* 8.2* 8.5*  MG  --   --   --  1.9  --   PHOS  --   --   --  2.9  --     GFR: Estimated Creatinine Clearance: 80.1 mL/min (by C-G formula based on SCr of 0.68 mg/dL).  Liver Function Tests: Recent Labs  Lab 10/04/21 0406  AST 40  ALT 28  ALKPHOS 37*  BILITOT 0.7  PROT 6.0*  ALBUMIN 3.5    CBG: No results for input(s): GLUCAP in the last 168 hours.   Recent Results (from the past 240 hour(s))  Resp Panel by RT-PCR (Flu A&B, Covid) Nasopharyngeal Swab     Status: None   Collection Time: 10/01/21  5:22 PM   Specimen: Nasopharyngeal Swab; Nasopharyngeal(NP) swabs in vial transport medium  Result Value Ref Range Status    SARS Coronavirus 2 by RT PCR NEGATIVE NEGATIVE Final    Comment: (NOTE) SARS-CoV-2 target nucleic acids are NOT DETECTED.  The SARS-CoV-2 RNA is generally detectable in upper respiratory specimens during the acute phase of infection. The lowest concentration of SARS-CoV-2 viral copies this assay can detect is 138 copies/mL. A negative result does not preclude SARS-Cov-2 infection and should not be used as the sole basis for treatment or other patient management decisions. A negative result may occur with  improper specimen collection/handling, submission of specimen other than nasopharyngeal swab, presence of viral mutation(s) within the areas targeted by this assay, and inadequate number of viral copies(<138 copies/mL). A negative result must be combined with clinical observations, patient history, and epidemiological information. The expected result is Negative.  Fact Sheet for Patients:  BloggerCourse.com  Fact Sheet for Healthcare Providers:  SeriousBroker.it  This test is no t yet approved or cleared by the [place] FDA and  has been authorized for detection and/or diagnosis of SARS-CoV-2 by FDA under an Emergency Use Authorization (EUA). This EUA will remain  in effect (meaning this test can be used) for the duration of the COVID-19 declaration under Section 564(b)(1) of the Act, 21 U.S.C.section 360bbb-3(b)(1), unless the authorization is terminated  or revoked sooner.       Influenza A by PCR NEGATIVE NEGATIVE Final   Influenza B by PCR NEGATIVE NEGATIVE Final    Comment: (NOTE) The Xpert Xpress SARS-CoV-2/FLU/RSV plus assay is intended as an aid in the diagnosis of influenza from Nasopharyngeal swab specimens and should not be used as a sole basis for treatment. Nasal washings and aspirates are unacceptable for Xpert Xpress SARS-CoV-2/FLU/RSV testing.  Fact Sheet for  Patients: BloggerCourse.com  Fact Sheet for Healthcare Providers: SeriousBroker.it  This test is not yet approved or cleared by the [place] FDA and has been authorized for detection and/or diagnosis of SARS-CoV-2 by FDA under an Emergency Use Authorization (EUA). This EUA will remain in effect (meaning this test can be used) for the duration of the COVID-19 declaration under Section 564(b)(1) of the Act, 21 U.S.C. section 360bbb-3(b)(1), unless the authorization is terminated or revoked.  Performed at [facility], [address] [place], [place] [address]          Radiology Studies: DG Chest 2 View  Result Date: 10/03/2021 CLINICAL DATA:  Chest pain EXAM: CHEST - 2 VIEW COMPARISON:  10/01/2021 FINDINGS: The heart size and mediastinal contours are within normal limits. Chronic interstitial changes. No new consolidation. No pleural effusion or pneumothorax. The visualized skeletal structures are unremarkable. IMPRESSION: No acute process in the chest. Electronically Signed   By: Guadlupe Spanish M.D.   On: 10/03/2021 20:31        Scheduled Meds:  [START ON 10/05/2021] azithromycin  250 mg Oral Daily   dextromethorphan-guaiFENesin  1 tablet Oral BID   enoxaparin (LOVENOX) injection  40 mg Subcutaneous Q24H   ipratropium-albuterol  3 mL Nebulization Q6H   methylPREDNISolone (SOLU-MEDROL) injection  40 mg Intravenous Q12H   pantoprazole  40 mg Oral Daily   Continuous Infusions:   LOS: 0 days   Time spent: Greater than 50% of this time was spent in counseling, explanation of diagnosis, planning of further management, and coordination of care.   Voice Recognition Reubin Milan dictation system was used to create this note, attempts have been made to correct errors. Please contact the author with questions and/or clarifications.   Albertine Grates, MD PhD FACP [facility]  Available via Epic secure chat 7am-7pm for  nonurgent issues Please page for urgent issues To page the attending provider between 7A-7P or the covering  provider during after hours 7P-7A, please log into the web site [email or web address] and access using universal  password for that web site. If you do not have the password, please call the hospital operator.    10/04/2021, 6:24 PM

## 2021-10-05 ENCOUNTER — Inpatient Hospital Stay (HOSPITAL_COMMUNITY): Payer: Federal, State, Local not specified - PPO

## 2021-10-05 LAB — BASIC METABOLIC PANEL
Anion gap: 5 (ref 5–15)
BUN: 12 mg/dL (ref 8–23)
CO2: 26 mmol/L (ref 22–32)
Calcium: 8.2 mg/dL — ABNORMAL LOW (ref 8.9–10.3)
Chloride: 88 mmol/L — ABNORMAL LOW (ref 98–111)
Creatinine, Ser: 0.59 mg/dL (ref 0.44–1.00)
GFR, Estimated: >60 mL/min (ref >60)
Glucose, Bld: 103 mg/dL — ABNORMAL HIGH (ref 70–99)
Potassium: 4.7 mmol/L (ref 3.5–5.1)
Sodium: 119 mmol/L — CL (ref 135–145)

## 2021-10-05 LAB — SODIUM, URINE, RANDOM: Sodium, Ur: 64 mmol/L

## 2021-10-05 LAB — OSMOLALITY, URINE: Osmolality, Ur: 210 mOsm/kg — ABNORMAL LOW (ref 300–900)

## 2021-10-05 LAB — URIC ACID: Uric Acid, Serum: 3.7 mg/dL (ref 2.5–7.1)

## 2021-10-05 LAB — CORTISOL: Cortisol, Plasma: 3.5 ug/dL

## 2021-10-05 MED ORDER — PREDNISONE 20 MG PO TABS
40.0000 mg | ORAL_TABLET | Freq: Every day | ORAL | Status: DC
  Administered 2021-10-06: 40 mg via ORAL
  Filled 2021-10-05: qty 2

## 2021-10-05 MED ORDER — SODIUM CHLORIDE 1 G PO TABS
1.0000 g | ORAL_TABLET | Freq: Two times a day (BID) | ORAL | Status: DC
  Administered 2021-10-05 – 2021-10-07 (×4): 1 g via ORAL
  Filled 2021-10-05 (×4): qty 1

## 2021-10-05 MED ORDER — FUROSEMIDE 10 MG/ML IJ SOLN
20.0000 mg | Freq: Every day | INTRAMUSCULAR | Status: AC
  Administered 2021-10-05 – 2021-10-06 (×2): 20 mg via INTRAVENOUS
  Filled 2021-10-05 (×2): qty 2

## 2021-10-05 NOTE — Progress Notes (Signed)
PROGRESS NOTE    Cynthia [name]  WUJ:[id number] DOB: 19-Apr-1956 DOA: 10/03/2021 PCP: Assunta Found, MD    Chief Complaint  Patient presents with   Shortness of Breath    Chest pain and cough    Brief Narrative:  H/o COPD, HTN, tobacco use was admitted to the hospital on 10/23 due to ongoing cough for 10 days, resend off AMA on 10/24, return to ED on 10/25 due to persistent short of breath cough  Subjective:  Persistent hyponatremia, denies headache,  She is oriented x3 this morning, she is anxious, she wants to go home I did not hear cough today, she is currently on room air, she does not want to wear tele monitor  Significant other at bedside   Assessment & Plan:   Principal Problem:   Hyponatremia Active Problems:   COPD with acute exacerbation (HCC)   Leukocytosis   Obesity   Confusion   Acute on chronic respiratory failure with hypoxia and hypercapnia (HCC)   COPD exacerbation -Tested negative for COVID-19/influenza on 10/23 -Continue IV steroid, DuoNeb, antibiotics -improving, wean oxygen, taper steroids  Hyponatremia Appear acute on chronic She does take HCTZ at home, which is held currently Difficult to assess volume status, she does has bilateral lower extremity edema left worse than the right Serum osmolality 251, urine osmolality in the 300, urine sodium 61 on 10/26, it appeared that she is on HCTZ at home also received fluids on admission not sure urine studies is reliable TSH unremarkable,  a.m. cortisol level in process uric acid 3.7  repeat urine sodium, urine osmo ordered on 10/27 Case discussed with nephrology Dr. Hyman Hopes who recommend fluids restriction, salt tablet supplement, Lasix 20 mg IV daily x2 and likely have underlying SIADH Dr. Valarie Cones will see patient in consult  Bilateral lower extremity pitting edema, left > right venous doppler no DVT Elevate leg,  She is also started on lasix, will monitor effect  HTN Stable without home  meds    obesity: Body mass index is 34.11 kg/m.Marland Kitchen      Unresulted Labs (From admission, onward)     Start     Ordered   10/10/21 0500  Creatinine, serum  (enoxaparin (LOVENOX)    CrCl >/= 30 ml/min)  Weekly,   R     Comments: while on enoxaparin therapy    10/03/21 2233   10/06/21 0500  Basic metabolic panel  Tomorrow morning,   R        10/05/21 1845   10/06/21 0500  Magnesium  Tomorrow morning,   R        10/05/21 1845   10/05/21 0846  Osmolality, urine  Once,   R        10/05/21 0845   10/04/21 1239  Expectorated Sputum Assessment w Gram Stain, Rflx to Resp Cult  Once,   R        10/04/21 1238              DVT prophylaxis: enoxaparin (LOVENOX) injection 40 mg Start: 10/04/21 1000 SCDs Start: 10/03/21 2234   Code Status:full Family Communication: patient  Disposition:   Status is: Inpatient   Dispo: The patient is from: home              Anticipated d/c is to: home              Anticipated d/c date is: home when  sodium level start to improve  Consultants:  nephrology for hyponatremia   Procedures:  none  Antimicrobials:    Anti-infectives (From admission, onward)    Start     Dose/Rate Route Frequency Ordered Stop   10/05/21 1000  azithromycin (ZITHROMAX) tablet 250 mg       See Hyperspace for full Linked Orders Report.   250 mg Oral Daily 10/04/21 0234 10/09/21 0959   10/04/21 1000  azithromycin (ZITHROMAX) tablet 500 mg       See Hyperspace for full Linked Orders Report.   500 mg Oral Daily 10/04/21 0234 10/04/21 1028          Objective: Vitals:   10/04/21 2133 10/05/21 0504 10/05/21 0805 10/05/21 1242  BP: 117/67 122/66  123/78  Pulse: (!) 57 (!) 53  (!) 58  Resp: 15 16  20   Temp: 98.5 F (36.9 C) 98.2 F (36.8 C)  (!) 97.5 F (36.4 C)  TempSrc: Oral Oral  Oral  SpO2: 96% 99% 97% 100%  Weight:      Height:        Intake/Output Summary (Last 24 hours) at 10/05/2021 1848 Last data filed at 10/05/2021  1700 Gross per 24 hour  Intake 600 ml  Output 500 ml  Net 100 ml   Filed Weights   10/03/21 1954 10/04/21 1100  Weight: 93.1 kg 93 kg    Examination:  General exam: alert, awake, communicative,calm, NAD, cough appear has resolved  Respiratory system: Improved aeration, wheezing appear has resolved, respiratory effort normal. Cardiovascular system:  RRR.  Gastrointestinal system: Abdomen is nondistended, soft and nontender.  Normal bowel sounds heard. Central nervous system: Alert and oriented. No focal neurological deficits. Extremities:  bilateral lower extremity pitting edema, appear to have venous stasis changes, left lower extremity edema> right  Skin: No rashes, lesions or ulcers Psychiatry: anxious .     Data Reviewed: I have personally reviewed following labs and imaging studies  CBC: Recent Labs  Lab 10/01/21 1650 10/02/21 0413 10/03/21 2007 10/04/21 0406  WBC 13.8* 14.2* 12.2* 10.7*  NEUTROABS 9.8*  --   --   --   HGB 14.0 14.4 14.4 13.4  HCT 39.8 40.8 40.8 38.7  MCV 90.0 89.5 90.7 91.7  PLT 279 305 290 265    Basic Metabolic Panel: Recent Labs  Lab 10/02/21 0413 10/03/21 2007 10/04/21 0406 10/04/21 1052 10/05/21 0540  NA 116* 118* 121* 119* 119*  K 4.4 4.2 4.2 4.4 4.7  CL 81* 82* 87* 86* 88*  CO2 25 28 28 26 26   GLUCOSE 139* 101* 89 136* 103*  BUN 17 12 12 14 12   CREATININE 0.64 0.63 0.68 0.68 0.59  CALCIUM 8.2* 8.4* 8.2* 8.5* 8.2*  MG  --   --  1.9  --   --   PHOS  --   --  2.9  --   --     GFR: Estimated Creatinine Clearance: 80.1 mL/min (by C-G formula based on SCr of 0.59 mg/dL).  Liver Function Tests: Recent Labs  Lab 10/04/21 0406  AST 40  ALT 28  ALKPHOS 37*  BILITOT 0.7  PROT 6.0*  ALBUMIN 3.5    CBG: No results for input(s): GLUCAP in the last 168 hours.   Recent Results (from the past 240 hour(s))  Resp Panel by RT-PCR (Flu A&B, Covid) Nasopharyngeal Swab     Status: None   Collection Time: 10/01/21  5:22 PM    Specimen: Nasopharyngeal Swab; Nasopharyngeal(NP) swabs in vial transport medium  Result Value Ref Range  Status   SARS Coronavirus 2 by RT PCR NEGATIVE NEGATIVE Final    Comment: (NOTE) SARS-CoV-2 target nucleic acids are NOT DETECTED.  The SARS-CoV-2 RNA is generally detectable in upper respiratory specimens during the acute phase of infection. The lowest concentration of SARS-CoV-2 viral copies this assay can detect is 138 copies/mL. A negative result does not preclude SARS-Cov-2 infection and should not be used as the sole basis for treatment or other patient management decisions. A negative result may occur with  improper specimen collection/handling, submission of specimen other than nasopharyngeal swab, presence of viral mutation(s) within the areas targeted by this assay, and inadequate number of viral copies(<138 copies/mL). A negative result must be combined with clinical observations, patient history, and epidemiological information. The expected result is Negative.  Fact Sheet for Patients:  BloggerCourse.com  Fact Sheet for Healthcare Providers:  SeriousBroker.it  This test is no t yet approved or cleared by the [place] FDA and  has been authorized for detection and/or diagnosis of SARS-CoV-2 by FDA under an Emergency Use Authorization (EUA). This EUA will remain  in effect (meaning this test can be used) for the duration of the COVID-19 declaration under Section 564(b)(1) of the Act, 21 U.S.C.section 360bbb-3(b)(1), unless the authorization is terminated  or revoked sooner.       Influenza A by PCR NEGATIVE NEGATIVE Final   Influenza B by PCR NEGATIVE NEGATIVE Final    Comment: (NOTE) The Xpert Xpress SARS-CoV-2/FLU/RSV plus assay is intended as an aid in the diagnosis of influenza from Nasopharyngeal swab specimens and should not be used as a sole basis for treatment. Nasal washings and aspirates are  unacceptable for Xpert Xpress SARS-CoV-2/FLU/RSV testing.  Fact Sheet for Patients: BloggerCourse.com  Fact Sheet for Healthcare Providers: SeriousBroker.it  This test is not yet approved or cleared by the [place] FDA and has been authorized for detection and/or diagnosis of SARS-CoV-2 by FDA under an Emergency Use Authorization (EUA). This EUA will remain in effect (meaning this test can be used) for the duration of the COVID-19 declaration under Section 564(b)(1) of the Act, 21 U.S.C. section 360bbb-3(b)(1), unless the authorization is terminated or revoked.  Performed at [facility], [address] [place], [place] [address]          Radiology Studies: DG Chest 2 View  Result Date: 10/03/2021 CLINICAL DATA:  Chest pain EXAM: CHEST - 2 VIEW COMPARISON:  10/01/2021 FINDINGS: The heart size and mediastinal contours are within normal limits. Chronic interstitial changes. No new consolidation. No pleural effusion or pneumothorax. The visualized skeletal structures are unremarkable. IMPRESSION: No acute process in the chest. Electronically Signed   By: Guadlupe Spanish M.D.   On: 10/03/2021 20:31   US Venous Img Lower Bilateral (DVT)  Result Date: 10/05/2021 CLINICAL DATA:  65 year old female with history of lower extremity edema. EXAM: BILATERAL LOWER EXTREMITY VENOUS DOPPLER ULTRASOUND TECHNIQUE: Gray-scale sonography with graded compression, as well as color Doppler and duplex ultrasound were performed to evaluate the lower extremity deep venous systems from the level of the common femoral vein and including the common femoral, femoral, profunda femoral, popliteal and calf veins including the posterior tibial, peroneal and gastrocnemius veins when visible. The superficial great saphenous vein was also interrogated. Spectral Doppler was utilized to evaluate flow at rest and with distal augmentation maneuvers in the common femoral,  femoral and popliteal veins. COMPARISON:  None. FINDINGS: RIGHT LOWER EXTREMITY Common Femoral Vein: No evidence of thrombus. Normal compressibility, respiratory phasicity and response to  augmentation. Saphenofemoral Junction: No evidence of thrombus. Normal compressibility and flow on color Doppler imaging. Profunda Femoral Vein: No evidence of thrombus. Normal compressibility and flow on color Doppler imaging. Femoral Vein: No evidence of thrombus. Normal compressibility, respiratory phasicity and response to augmentation. Popliteal Vein: No evidence of thrombus. Normal compressibility, respiratory phasicity and response to augmentation. Calf Veins: No evidence of thrombus. Normal compressibility and flow on color Doppler imaging. Other Findings:  None. LEFT LOWER EXTREMITY Common Femoral Vein: No evidence of thrombus. Normal compressibility, respiratory phasicity and response to augmentation. Saphenofemoral Junction: No evidence of thrombus. Normal compressibility and flow on color Doppler imaging. Profunda Femoral Vein: No evidence of thrombus. Normal compressibility and flow on color Doppler imaging. Femoral Vein: No evidence of thrombus. Normal compressibility, respiratory phasicity and response to augmentation. Popliteal Vein: No evidence of thrombus. Normal compressibility, respiratory phasicity and response to augmentation. Calf Veins: No evidence of thrombus. Normal compressibility and flow on color Doppler imaging. Other Findings:  None. IMPRESSION: No evidence of bilateral lower extremity deep venous thrombosis. Marliss Coots, MD [facility] Electronically Signed   By: Marliss Coots M.D.   On: 10/05/2021 12:55        Scheduled Meds:  azithromycin  250 mg Oral Daily   dextromethorphan-guaiFENesin  1 tablet Oral BID   enoxaparin (LOVENOX) injection  40 mg Subcutaneous Q24H   furosemide  20 mg Intravenous Daily   ipratropium-albuterol  3  mL Nebulization BID   pantoprazole  40 mg Oral Daily   [START ON 10/06/2021] predniSONE  40 mg Oral Q breakfast   sodium chloride  1 g Oral BID WC   Continuous Infusions:   LOS: 1 day   Time spent: Greater than 50% of this time was spent in counseling, explanation of diagnosis, planning of further management, and coordination of care.   Voice Recognition Reubin Milan dictation system was used to create this note, attempts have been made to correct errors. Please contact the author with questions and/or clarifications.   Albertine Grates, MD PhD FACP [facility]  Available via Epic secure chat 7am-7pm for nonurgent issues Please page for urgent issues To page the attending provider between 7A-7P or the covering provider during after hours 7P-7A, please log into the web site [email or web address] and access using universal [place] password for that web site. If you do not have the password, please call the hospital operator.    10/05/2021, 6:48 PM

## 2021-10-05 NOTE — Consult Note (Signed)
Reason for Consult: Chronic Hyponatremia  Referring Physician:    [name] is an 65 y.o. female.   HPI:  65 year old lady with low urine sodiums for at least 3 years   normal renal function. Serum sodium 119 today  cortisol 3.5       urine osm   375   urine sodium 64    home meds include olmesartan/ HCTZ.    Serum osm  251  Volume appears increased with inc lower extremity edema  BP 123/78   p 58  T 97.5  Na 119  K 4.7    Cl 88   CO2  26  Glc 103         Trend in Creatinine: Creatinine, Ser  Date/Time Value Ref Range Status  10/05/2021 05:40 AM 0.59 0.44 - 1.00 mg/dL Final  36/64/4034 74:25 AM 0.68 0.44 - 1.00 mg/dL Final  95/63/8756 43:32 AM 0.68 0.44 - 1.00 mg/dL Final  95/18/8416 60:63 PM 0.63 0.44 - 1.00 mg/dL Final  01/60/1093 23:55 AM 0.64 0.44 - 1.00 mg/dL Final  73/22/0254 27:06 PM 0.67 0.44 - 1.00 mg/dL Final  23/76/2831 51:76 PM 0.66 0.44 - 1.00 mg/dL Final  16/06/3709 62:69 AM 0.58 0.44 - 1.00 mg/dL Final  48/54/6270 35:00 AM 0.61 0.44 - 1.00 mg/dL Final  93/81/8299 37:16 PM 0.66 0.44 - 1.00 mg/dL Final   Sodium  Date/Time Value Ref Range Status  10/05/2021 05:40 AM 119 (LL) 135 - 145 mmol/L Final    Comment:    CRITICAL RESULT CALLED TO, READ BACK BY AND VERIFIED WITH: EVANS,T AT 7:00AM ON 10/05/21 BY [facility]   10/04/2021 10:52 AM 119 (LL) 135 - 145 mmol/L Final    Comment:    CRITICAL RESULT CALLED TO, READ BACK BY AND VERIFIED WITH: ALSTON,C AT 11:20AM ON 10/04/21 BY FESTERMAN,C   10/04/2021 04:06 AM 121 (L) 135 - 145 mmol/L Final  10/03/2021 08:07 PM 118 (LL) 135 - 145 mmol/L Final    Comment:    CRITICAL RESULT CALLED TO, READ BACK BY AND VERIFIED WITH: BELTON,K ON 10/03/21 AT 2040 BY LOY,C   10/02/2021 04:13 AM 116 (LL) 135 - 145 mmol/L Final    Comment:    CRITICAL RESULT CALLED TO, READ BACK BY AND VERIFIED WITH: JONES,J AT 5:10AM ON 10/02/21 BY FESTERMAN,C   10/01/2021 10:05 PM 115 (LL) 135 - 145 mmol/L Final    Comment:     CRITICAL RESULT CALLED TO, READ BACK BY AND VERIFIED WITH: JONES,J@2237  BY MATTHEWS, B 10.23.22   10/01/2021 04:50 PM 115 (LL) 135 - 145 mmol/L Final    Comment:    CRITICAL RESULT CALLED TO, READ BACK BY AND VERIFIED WITH:  B OAKLEY @ 1725 10/01/21 BY STEPHTR   02/24/2018 06:17 AM 124 (L) 135 - 145 mmol/L Final    Comment:    DELTA CHECK NOTED  02/23/2018 09:17 AM 116 (LL) 135 - 145 mmol/L Final    Comment:    CRITICAL RESULT CALLED TO, READ BACK BY AND VERIFIED WITH: CARDWELL,L@1053  BY MATTHEWS, B 3.17.19   02/22/2018 10:21 PM 116 (LL) 135 - 145 mmol/L Final    Comment:    CRITICAL RESULT CALLED TO, READ BACK BY AND VERIFIED WITH: MICHELLE,RN AT 2259 ON 02/22/2018 BY MOSLEY,J    PMH:   Past Medical History:  Diagnosis Date   Elevated triglycerides with high cholesterol    Hypertension    S/P tooth extraction Feb 2014   for dentures   Tobacco  use     PSH:   Past Surgical History:  Procedure Laterality Date   ABDOMINAL HYSTERECTOMY     2 partial    Allergies: No Known Allergies  Medications:   Prior to Admission medications   Medication Sig Start Date End Date Taking? Authorizing Provider  acetaminophen (TYLENOL) 325 MG tablet Take 650 mg by mouth every 6 (six) hours as needed for mild pain, moderate pain, fever or headache.   Yes [provider]  aspirin EC 81 MG tablet Take 81 mg by mouth daily.   Yes [provider]  fenofibrate (TRICOR) 145 MG tablet Take 145 mg by mouth daily.   Yes [provider]  olmesartan-hydrochlorothiazide (BENICAR HCT) 40-25 MG tablet Take 1 tablet by mouth daily. 06/29/21  Yes [provider]    Discontinued Meds:   Medications Discontinued During This Encounter  Medication Reason   polyethylene glycol-electrolytes (NULYTELY) 420 g solution Patient Preference   ipratropium-albuterol (DUONEB) 0.5-2.5 (3) MG/3ML nebulizer solution 3 mL     Social History:  reports that she has been smoking  cigarettes. She has a 30.00 pack-year smoking history. She has never used smokeless tobacco. She reports that she does not drink alcohol and does not use drugs.  Family History:   Family History  Problem Relation Age of Onset   Diabetes Mother    Stroke Maternal Grandmother     ROS  negative  pertinent for HPI  Blood pressure 123/78, pulse (!) 58, temperature (!) 97.5 F (36.4 C), temperature source Oral, resp. rate 20, height 5\' 5"  (1.651 m), weight 93 kg, SpO2 100 %.    General exam: alert non distressed  Respiratory system:  some scattered wheezes Cardiovascular system:  RRR.  Gastrointestinal system: Abdomen is nondistended, soft and nontender.  Normal bowel sounds Central nervous system: Alert and oriented. No focal neurological deficits. Extremities:  bilateral lower extremity pitting edema,   Skin: No rashes, lesions or ulcers Psychiatry: Judgement and insight appear normal. Mood & affect appropriate.   Labs: Basic Metabolic Panel: Recent Labs  Lab 10/01/21 1650 10/01/21 2205 10/02/21 0413 10/03/21 2007 10/04/21 0406 10/04/21 1052 10/05/21 0540  NA 115* 115* 116* 118* 121* 119* 119*  K 4.7 4.3 4.4 4.2 4.2 4.4 4.7  CL 79* 80* 81* 82* 87* 86* 88*  CO2 28 26 25 28 28 26 26   GLUCOSE 99 131* 139* 101* 89 136* 103*  BUN 19 18 17 12 12 14 12   CREATININE 0.66 0.67 0.64 0.63 0.68 0.68 0.59  ALBUMIN  --   --   --   --  3.5  --   --   CALCIUM 8.5* 8.1* 8.2* 8.4* 8.2* 8.5* 8.2*  PHOS  --   --   --   --  2.9  --   --    Liver Function Tests: Recent Labs  Lab 10/04/21 0406  AST 40  ALT 28  ALKPHOS 37*  BILITOT 0.7  PROT 6.0*  ALBUMIN 3.5   No results for input(s): LIPASE, AMYLASE in the last 168 hours. No results for input(s): AMMONIA in the last 168 hours. CBC: Recent Labs  Lab 10/01/21 1650 10/02/21 0413 10/03/21 2007 10/04/21 0406  WBC 13.8* 14.2* 12.2* 10.7*  NEUTROABS 9.8*  --   --   --   HGB 14.0 14.4 14.4 13.4  HCT 39.8 40.8 40.8 38.7  MCV 90.0  89.5 90.7 91.7  PLT 279 305 290 265   PT/INR: @labrcntip (inr:5) Cardiac Enzymes: No results for  input(s): CKTOTAL, CKMB, CKMBINDEX, TROPONINI in the last 168 hours. CBG: No results for input(s): GLUCAP in the last 168 hours.  Iron Studies: No results for input(s): IRON, TIBC, TRANSFERRIN, FERRITIN in the last 168 hours.  Xrays/Other Studies: DG Chest 2 View  Result Date: 10/03/2021 CLINICAL DATA:  Chest pain EXAM: CHEST - 2 VIEW COMPARISON:  10/01/2021 FINDINGS: The heart size and mediastinal contours are within normal limits. Chronic interstitial changes. No new consolidation. No pleural effusion or pneumothorax. The visualized skeletal structures are unremarkable. IMPRESSION: No acute process in the chest. Electronically Signed   By: Guadlupe Spanish M.D.   On: 10/03/2021 20:31   US Venous Img Lower Bilateral (DVT)  Result Date: 10/05/2021 CLINICAL DATA:  65 year old female with history of lower extremity edema. EXAM: BILATERAL LOWER EXTREMITY VENOUS DOPPLER ULTRASOUND TECHNIQUE: Gray-scale sonography with graded compression, as well as color Doppler and duplex ultrasound were performed to evaluate the lower extremity deep venous systems from the level of the common femoral vein and including the common femoral, femoral, profunda femoral, popliteal and calf veins including the posterior tibial, peroneal and gastrocnemius veins when visible. The superficial great saphenous vein was also interrogated. Spectral Doppler was utilized to evaluate flow at rest and with distal augmentation maneuvers in the common femoral, femoral and popliteal veins. COMPARISON:  None. FINDINGS: RIGHT LOWER EXTREMITY Common Femoral Vein: No evidence of thrombus. Normal compressibility, respiratory phasicity and response to augmentation. Saphenofemoral Junction: No evidence of thrombus. Normal compressibility and flow on color Doppler imaging. Profunda Femoral Vein: No evidence of thrombus. Normal compressibility and  flow on color Doppler imaging. Femoral Vein: No evidence of thrombus. Normal compressibility, respiratory phasicity and response to augmentation. Popliteal Vein: No evidence of thrombus. Normal compressibility, respiratory phasicity and response to augmentation. Calf Veins: No evidence of thrombus. Normal compressibility and flow on color Doppler imaging. Other Findings:  None. LEFT LOWER EXTREMITY Common Femoral Vein: No evidence of thrombus. Normal compressibility, respiratory phasicity and response to augmentation. Saphenofemoral Junction: No evidence of thrombus. Normal compressibility and flow on color Doppler imaging. Profunda Femoral Vein: No evidence of thrombus. Normal compressibility and flow on color Doppler imaging. Femoral Vein: No evidence of thrombus. Normal compressibility, respiratory phasicity and response to augmentation. Popliteal Vein: No evidence of thrombus. Normal compressibility, respiratory phasicity and response to augmentation. Calf Veins: No evidence of thrombus. Normal compressibility and flow on color Doppler imaging. Other Findings:  None. IMPRESSION: No evidence of bilateral lower extremity deep venous thrombosis. Marliss Coots, MD [facility] Electronically Signed   By: Marliss Coots M.D.   On: 10/05/2021 12:55     Assessment/Plan: Hyponatremia  possibly euvolemic/ hypervolemic  labs look like SIADH pattern and chronicity would essentially rule out an acute process. I think it is real and not pseudohypnatremia. She may have ADH independent mechanism with a reset osmostat. Especially with the chronicity. Both ACE inhibitors and thiazides have been associated with increase ADH. Therefore these can be stopped. Lasix will lower the urine osm and may be helpful in correcting the sodium.   Garnetta Buddy 10/05/2021, 1:51 PM

## 2021-10-06 LAB — BASIC METABOLIC PANEL
Anion gap: 7 (ref 5–15)
BUN: 15 mg/dL (ref 8–23)
CO2: 30 mmol/L (ref 22–32)
Calcium: 8.8 mg/dL — ABNORMAL LOW (ref 8.9–10.3)
Chloride: 84 mmol/L — ABNORMAL LOW (ref 98–111)
Creatinine, Ser: 0.69 mg/dL (ref 0.44–1.00)
GFR, Estimated: >60 mL/min (ref >60)
Glucose, Bld: 74 mg/dL (ref 70–99)
Potassium: 4.3 mmol/L (ref 3.5–5.1)
Sodium: 121 mmol/L — ABNORMAL LOW (ref 135–145)

## 2021-10-06 LAB — MAGNESIUM: Magnesium: 1.9 mg/dL (ref 1.7–2.4)

## 2021-10-06 MED ORDER — SENNOSIDES-DOCUSATE SODIUM 8.6-50 MG PO TABS
1.0000 | ORAL_TABLET | Freq: Every day | ORAL | Status: DC
  Administered 2021-10-06: 1 via ORAL
  Filled 2021-10-06: qty 1

## 2021-10-06 MED ORDER — PREDNISONE 20 MG PO TABS
20.0000 mg | ORAL_TABLET | Freq: Every day | ORAL | Status: DC
  Administered 2021-10-07: 20 mg via ORAL
  Filled 2021-10-06: qty 1

## 2021-10-06 MED ORDER — FUROSEMIDE 20 MG PO TABS
20.0000 mg | ORAL_TABLET | Freq: Two times a day (BID) | ORAL | Status: DC
  Administered 2021-10-07: 20 mg via ORAL
  Filled 2021-10-06: qty 1

## 2021-10-06 NOTE — Progress Notes (Signed)
[facility] ROUNDING NOTE   Subjective:   Interval History:  65 year old lady with low urine sodiums for at least 3 years   normal renal function. Serum sodium 119 today  cortisol 3.5       urine osm   375   urine sodium 64    home meds include olmesartan/ HCTZ.    Serum osm  251  Blood pressure 115/72 pulse 59 temperature 98.3 O2 sats 93% room air  Urine output 500 cc 10/05/2021  Sodium 131 potassium 4.3 chloride 84 CO2 30 BUN 15 creatinine 0.7 glucose 74 calcium 8.8 urine osmolality 210 urine sodium 64      Objective:  Vital signs in last 24 hours:  Temp:  [97.5 F (36.4 C)-98.3 F (36.8 C)] 98.3 F (36.8 C) (10/28 0528) Pulse Rate:  [58-59] 59 (10/28 0528) Resp:  [16-20] 16 (10/28 0528) BP: (115-132)/(69-78) 115/72 (10/28 0528) SpO2:  [93 %-100 %] 93 % (10/28 0528)  Weight change:  Filed Weights   10/03/21 1954 10/04/21 1100  Weight: 93.1 kg 93 kg    Intake/Output: I/O last 3 completed shifts: In: 840 [P.O.:840] Out: 500 [Urine:500]   Intake/Output this shift:  No intake/output data recorded.  CVS- RRR RS- CTA ABD- BS present soft non-distended EXT-bilateral non-pitting edema   Basic Metabolic Panel: Recent Labs  Lab 10/03/21 2007 10/04/21 0406 10/04/21 1052 10/05/21 0540 10/06/21 0454  NA 118* 121* 119* 119* 121*  K 4.2 4.2 4.4 4.7 4.3  CL 82* 87* 86* 88* 84*  CO2 28 28 26 26 30   GLUCOSE 101* 89 136* 103* 74  BUN 12 12 14 12 15   CREATININE 0.63 0.68 0.68 0.59 0.69  CALCIUM 8.4* 8.2* 8.5* 8.2* 8.8*  MG  --  1.9  --   --  1.9  PHOS  --  2.9  --   --   --     Liver Function Tests: Recent Labs  Lab 10/04/21 0406  AST 40  ALT 28  ALKPHOS 37*  BILITOT 0.7  PROT 6.0*  ALBUMIN 3.5   No results for input(s): LIPASE, AMYLASE in the last 168 hours. No results for input(s): AMMONIA in the last 168 hours.  CBC: Recent Labs  Lab 10/01/21 1650 10/02/21 0413 10/03/21 2007 10/04/21 0406  WBC 13.8* 14.2* 12.2* 10.7*  NEUTROABS  9.8*  --   --   --   HGB 14.0 14.4 14.4 13.4  HCT 39.8 40.8 40.8 38.7  MCV 90.0 89.5 90.7 91.7  PLT 279 305 290 265    Cardiac Enzymes: No results for input(s): CKTOTAL, CKMB, CKMBINDEX, TROPONINI in the last 168 hours.  BNP: Invalid input(s): POCBNP  CBG: No results for input(s): GLUCAP in the last 168 hours.  Microbiology: Results for orders placed or performed during the hospital encounter of 10/01/21  Resp Panel by RT-PCR (Flu A&B, Covid) Nasopharyngeal Swab     Status: None   Collection Time: 10/01/21  5:22 PM   Specimen: Nasopharyngeal Swab; Nasopharyngeal(NP) swabs in vial transport medium  Result Value Ref Range Status   SARS Coronavirus 2 by RT PCR NEGATIVE NEGATIVE Final    Comment: (NOTE) SARS-CoV-2 target nucleic acids are NOT DETECTED.  The SARS-CoV-2 RNA is generally detectable in upper respiratory specimens during the acute phase of infection. The lowest concentration of SARS-CoV-2 viral copies this assay can detect is 138 copies/mL. A negative result does not preclude SARS-Cov-2 infection and should not be used as the sole basis for treatment or  other patient management decisions. A negative result may occur with  improper specimen collection/handling, submission of specimen other than nasopharyngeal swab, presence of viral mutation(s) within the areas targeted by this assay, and inadequate number of viral copies(<138 copies/mL). A negative result must be combined with clinical observations, patient history, and epidemiological information. The expected result is Negative.  Fact Sheet for Patients:  BloggerCourse.com  Fact Sheet for Healthcare Providers:  SeriousBroker.it  This test is no t yet approved or cleared by the [place] FDA and  has been authorized for detection and/or diagnosis of SARS-CoV-2 by FDA under an Emergency Use Authorization (EUA). This EUA will remain  in effect (meaning this  test can be used) for the duration of the COVID-19 declaration under Section 564(b)(1) of the Act, 21 U.S.C.section 360bbb-3(b)(1), unless the authorization is terminated  or revoked sooner.       Influenza A by PCR NEGATIVE NEGATIVE Final   Influenza B by PCR NEGATIVE NEGATIVE Final    Comment: (NOTE) The Xpert Xpress SARS-CoV-2/FLU/RSV plus assay is intended as an aid in the diagnosis of influenza from Nasopharyngeal swab specimens and should not be used as a sole basis for treatment. Nasal washings and aspirates are unacceptable for Xpert Xpress SARS-CoV-2/FLU/RSV testing.  Fact Sheet for Patients: BloggerCourse.com  Fact Sheet for Healthcare Providers: SeriousBroker.it  This test is not yet approved or cleared by the [place] FDA and has been authorized for detection and/or diagnosis of SARS-CoV-2 by FDA under an Emergency Use Authorization (EUA). This EUA will remain in effect (meaning this test can be used) for the duration of the COVID-19 declaration under Section 564(b)(1) of the Act, 21 U.S.C. section 360bbb-3(b)(1), unless the authorization is terminated or revoked.  Performed at [facility], [address] [place], [place] [address]     Coagulation Studies: Recent Labs    10/04/21 0406  LABPROT 13.8  INR 1.1    Urinalysis: No results for input(s): COLORURINE, LABSPEC, PHURINE, GLUCOSEU, HGBUR, BILIRUBINUR, KETONESUR, PROTEINUR, UROBILINOGEN, NITRITE, LEUKOCYTESUR in the last 72 hours.  Invalid input(s): APPERANCEUR    Imaging: US Venous Img Lower Bilateral (DVT)  Result Date: 10/05/2021 CLINICAL DATA:  65 year old female with history of lower extremity edema. EXAM: BILATERAL LOWER EXTREMITY VENOUS DOPPLER ULTRASOUND TECHNIQUE: Gray-scale sonography with graded compression, as well as color Doppler and duplex ultrasound were performed to evaluate the lower extremity deep venous systems from the level  of the common femoral vein and including the common femoral, femoral, profunda femoral, popliteal and calf veins including the posterior tibial, peroneal and gastrocnemius veins when visible. The superficial great saphenous vein was also interrogated. Spectral Doppler was utilized to evaluate flow at rest and with distal augmentation maneuvers in the common femoral, femoral and popliteal veins. COMPARISON:  None. FINDINGS: RIGHT LOWER EXTREMITY Common Femoral Vein: No evidence of thrombus. Normal compressibility, respiratory phasicity and response to augmentation. Saphenofemoral Junction: No evidence of thrombus. Normal compressibility and flow on color Doppler imaging. Profunda Femoral Vein: No evidence of thrombus. Normal compressibility and flow on color Doppler imaging. Femoral Vein: No evidence of thrombus. Normal compressibility, respiratory phasicity and response to augmentation. Popliteal Vein: No evidence of thrombus. Normal compressibility, respiratory phasicity and response to augmentation. Calf Veins: No evidence of thrombus. Normal compressibility and flow on color Doppler imaging. Other Findings:  None. LEFT LOWER EXTREMITY Common Femoral Vein: No evidence of thrombus. Normal compressibility, respiratory phasicity and response to augmentation. Saphenofemoral Junction: No evidence of thrombus. Normal compressibility and flow on color Doppler  imaging. Profunda Femoral Vein: No evidence of thrombus. Normal compressibility and flow on color Doppler imaging. Femoral Vein: No evidence of thrombus. Normal compressibility, respiratory phasicity and response to augmentation. Popliteal Vein: No evidence of thrombus. Normal compressibility, respiratory phasicity and response to augmentation. Calf Veins: No evidence of thrombus. Normal compressibility and flow on color Doppler imaging. Other Findings:  None. IMPRESSION: No evidence of bilateral lower extremity deep venous thrombosis. Marliss Coots, MD [facility] Electronically Signed   By: Marliss Coots M.D.   On: 10/05/2021 12:55     Medications:     azithromycin  250 mg Oral Daily   dextromethorphan-guaiFENesin  1 tablet Oral BID   enoxaparin (LOVENOX) injection  40 mg Subcutaneous Q24H   furosemide  20 mg Intravenous Daily   pantoprazole  40 mg Oral Daily   predniSONE  40 mg Oral Q breakfast   sodium chloride  1 g Oral BID WC   guaiFENesin-dextromethorphan, ipratropium-albuterol  Assessment/ Plan:  Hyponatremia  possibly euvolemic/ hypervolemic  labs look like SIADH pattern and chronicity would essentially rule out an acute process. I think it is real and not pseudohypnatremia. She may have ADH independent mechanism with a reset osmostat. Especially with the chronicity. Both ACE inhibitors and thiazides have been associated with increase ADH. Therefore these can be stopped. Lasix will lower the urine osm and may be helpful in correcting the sodium.   2.   Sodium improving nicely we will continue current therapeutic plan.  Would consider changing IV Lasix to oral Lasix 20 mg twice daily 10/07/2021   LOS: 2 Garnetta Buddy @TODAY @8 :05 AM

## 2021-10-06 NOTE — Progress Notes (Signed)
PROGRESS NOTE    LADAISHA [name]  BDZ:[id number] DOB: 1956-08-23 DOA: 10/03/2021 PCP: Assunta Found, MD    Chief Complaint  Patient presents with   Shortness of Breath    Chest pain and cough    Brief Narrative:  H/o COPD, HTN, tobacco use was admitted to the hospital on 10/23 due to ongoing cough for 10 days, resend off AMA on 10/24, return to ED on 10/25 due to persistent short of breath cough  Subjective:  Reports feeling paranoids, less anxious , shakiness has resolved Breathing is much improved Sodium start to trend up    Assessment & Plan:   Principal Problem:   Hyponatremia Active Problems:   COPD with acute exacerbation (HCC)   Leukocytosis   Obesity   Confusion   Acute on chronic respiratory failure with hypoxia and hypercapnia (HCC)   COPD exacerbation -Tested negative for COVID-19/influenza on 10/23 -treated with  IV steroid, DuoNeb, antibiotics -improving, wean oxygen, taper steroids to off  Hyponatremia Appear acute on chronic She does take HCTZ at home, which is held currently, plan to discontinue HCTZ at discharge Difficult to assess volume status, she does has bilateral lower extremity edema left worse than the right Serum osmolality 251, urine osmolality in the 300, urine sodium 61 on 10/26, it appeared that she is on HCTZ at home also received fluids on admission not sure urine studies is reliable TSH unremarkable,   Has low a.m. cortisol level  while taking steroids for COPD, not able to do cosyntropin stim test while on steroid Likely will need referral to endocrinology for outpatient cosyntropin stim test once taper off steroid uric acid 3.7  Case discussed with nephrology Dr. Hyman Hopes who recommend fluids restriction, salt tablet supplement, Lasix 20 mg IV daily x2 then oral lasix 20mg  bid , as likely have underlying SIADH Dr. will follow patient in consult  Bilateral lower extremity pitting edema, left > right venous doppler no  DVT Elevate leg,  She is also started on lasix, will monitor effect  HTN Stable without home meds, should discontinue Benicar/HCTZ per nephrology recommendation, as both ARB's and HCTZ can cause hyponatremia Currently on iv lasix   obesity: Body mass index is 34.11 kg/m.Valarie [name]      Unresulted Labs (From admission, onward)     Start     Ordered   10/10/21 0500  Creatinine, serum  (enoxaparin (LOVENOX)    CrCl >/= 30 ml/min)  Weekly,   R     Comments: while on enoxaparin therapy    10/03/21 2233   10/07/21 0500  Basic metabolic panel  Daily,   R      10/06/21 1842   10/04/21 1239  Expectorated Sputum Assessment w Gram Stain, Rflx to Resp Cult  Once,   R        10/04/21 1238              DVT prophylaxis: enoxaparin (LOVENOX) injection 40 mg Start: 10/04/21 1000 SCDs Start: 10/03/21 2234   Code Status:full Family Communication: patient  Disposition:   Status is: Inpatient   Dispo: The patient is from: home              Anticipated d/c is to: home              Anticipated d/c date is: home when  sodium level start to improve                Consultants:  nephrology for hyponatremia  Procedures:  none  Antimicrobials:    Anti-infectives (From admission, onward)    Start     Dose/Rate Route Frequency Ordered Stop   10/05/21 1000  azithromycin (ZITHROMAX) tablet 250 mg       See Hyperspace for full Linked Orders Report.   250 mg Oral Daily 10/04/21 0234 10/09/21 0959   10/04/21 1000  azithromycin (ZITHROMAX) tablet 500 mg       See Hyperspace for full Linked Orders Report.   500 mg Oral Daily 10/04/21 0234 10/04/21 1028          Objective: Vitals:   10/05/21 2048 10/05/21 2125 10/06/21 0528 10/06/21 1309  BP:  132/69 115/72 116/71  Pulse:  (!) 59 (!) 59 62  Resp:  19 16 17   Temp:  98.2 F (36.8 C) 98.3 F (36.8 C) 98.6 F (37 C)  TempSrc:  Oral Oral Oral  SpO2: 96% 94% 93% 94%  Weight:      Height:        Intake/Output Summary (Last 24  hours) at 10/06/2021 1849 Last data filed at 10/06/2021 1700 Gross per 24 hour  Intake 960 ml  Output --  Net 960 ml   Filed Weights   10/03/21 1954 10/04/21 1100  Weight: 93.1 kg 93 kg    Examination:  General exam: alert, awake, communicative,calm, NAD, cough appear has resolved  Respiratory system: Improved aeration, wheezing appear has resolved, respiratory effort normal. Cardiovascular system:  RRR.  Gastrointestinal system: Abdomen is nondistended, soft and nontender.  Normal bowel sounds heard. Central nervous system: Alert and oriented x3. No focal neurological deficits. Extremities:  bilateral lower extremity pitting edema, appear to have venous stasis changes, left lower extremity edema> right  Skin: No rashes, lesions or ulcers Psychiatry: less anxious     Data Reviewed: I have personally reviewed following labs and imaging studies  CBC: Recent Labs  Lab 10/01/21 1650 10/02/21 0413 10/03/21 2007 10/04/21 0406  WBC 13.8* 14.2* 12.2* 10.7*  NEUTROABS 9.8*  --   --   --   HGB 14.0 14.4 14.4 13.4  HCT 39.8 40.8 40.8 38.7  MCV 90.0 89.5 90.7 91.7  PLT 279 305 290 265    Basic Metabolic Panel: Recent Labs  Lab 10/03/21 2007 10/04/21 0406 10/04/21 1052 10/05/21 0540 10/06/21 0454  NA 118* 121* 119* 119* 121*  K 4.2 4.2 4.4 4.7 4.3  CL 82* 87* 86* 88* 84*  CO2 28 28 26 26 30   GLUCOSE 101* 89 136* 103* 74  BUN 12 12 14 12 15   CREATININE 0.63 0.68 0.68 0.59 0.69  CALCIUM 8.4* 8.2* 8.5* 8.2* 8.8*  MG  --  1.9  --   --  1.9  PHOS  --  2.9  --   --   --     GFR: Estimated Creatinine Clearance: 80.1 mL/min (by C-G formula based on SCr of 0.69 mg/dL).  Liver Function Tests: Recent Labs  Lab 10/04/21 0406  AST 40  ALT 28  ALKPHOS 37*  BILITOT 0.7  PROT 6.0*  ALBUMIN 3.5    CBG: No results for input(s): GLUCAP in the last 168 hours.   Recent Results (from the past 240 hour(s))  Resp Panel by RT-PCR (Flu A&B, Covid) Nasopharyngeal Swab      Status: None   Collection Time: 10/01/21  5:22 PM   Specimen: Nasopharyngeal Swab; Nasopharyngeal(NP) swabs in vial transport medium  Result Value Ref Range Status   SARS Coronavirus 2 by RT PCR NEGATIVE NEGATIVE  Final    Comment: (NOTE) SARS-CoV-2 target nucleic acids are NOT DETECTED.  The SARS-CoV-2 RNA is generally detectable in upper respiratory specimens during the acute phase of infection. The lowest concentration of SARS-CoV-2 viral copies this assay can detect is 138 copies/mL. A negative result does not preclude SARS-Cov-2 infection and should not be used as the sole basis for treatment or other patient management decisions. A negative result may occur with  improper specimen collection/handling, submission of specimen other than nasopharyngeal swab, presence of viral mutation(s) within the areas targeted by this assay, and inadequate number of viral copies(<138 copies/mL). A negative result must be combined with clinical observations, patient history, and epidemiological information. The expected result is Negative.  Fact Sheet for Patients:  BloggerCourse.com  Fact Sheet for Healthcare Providers:  SeriousBroker.it  This test is no t yet approved or cleared by the [place] FDA and  has been authorized for detection and/or diagnosis of SARS-CoV-2 by FDA under an Emergency Use Authorization (EUA). This EUA will remain  in effect (meaning this test can be used) for the duration of the COVID-19 declaration under Section 564(b)(1) of the Act, 21 U.S.C.section 360bbb-3(b)(1), unless the authorization is terminated  or revoked sooner.       Influenza A by PCR NEGATIVE NEGATIVE Final   Influenza B by PCR NEGATIVE NEGATIVE Final    Comment: (NOTE) The Xpert Xpress SARS-CoV-2/FLU/RSV plus assay is intended as an aid in the diagnosis of influenza from Nasopharyngeal swab specimens and should not be used as a sole basis  for treatment. Nasal washings and aspirates are unacceptable for Xpert Xpress SARS-CoV-2/FLU/RSV testing.  Fact Sheet for Patients: BloggerCourse.com  Fact Sheet for Healthcare Providers: SeriousBroker.it  This test is not yet approved or cleared by the [place] FDA and has been authorized for detection and/or diagnosis of SARS-CoV-2 by FDA under an Emergency Use Authorization (EUA). This EUA will remain in effect (meaning this test can be used) for the duration of the COVID-19 declaration under Section 564(b)(1) of the Act, 21 U.S.C. section 360bbb-3(b)(1), unless the authorization is terminated or revoked.  Performed at [facility], [address], [place], [place] [address]          Radiology Studies: US Venous Img Lower Bilateral (DVT)  Result Date: 10/05/2021 CLINICAL DATA:  65 year old female with history of lower extremity edema. EXAM: BILATERAL LOWER EXTREMITY VENOUS DOPPLER ULTRASOUND TECHNIQUE: Gray-scale sonography with graded compression, as well as color Doppler and duplex ultrasound were performed to evaluate the lower extremity deep venous systems from the level of the common femoral vein and including the common femoral, femoral, profunda femoral, popliteal and calf veins including the posterior tibial, peroneal and gastrocnemius veins when visible. The superficial great saphenous vein was also interrogated. Spectral Doppler was utilized to evaluate flow at rest and with distal augmentation maneuvers in the common femoral, femoral and popliteal veins. COMPARISON:  None. FINDINGS: RIGHT LOWER EXTREMITY Common Femoral Vein: No evidence of thrombus. Normal compressibility, respiratory phasicity and response to augmentation. Saphenofemoral Junction: No evidence of thrombus. Normal compressibility and flow on color Doppler imaging. Profunda Femoral Vein: No evidence of thrombus. Normal compressibility and flow on color  Doppler imaging. Femoral Vein: No evidence of thrombus. Normal compressibility, respiratory phasicity and response to augmentation. Popliteal Vein: No evidence of thrombus. Normal compressibility, respiratory phasicity and response to augmentation. Calf Veins: No evidence of thrombus. Normal compressibility and flow on color Doppler imaging. Other Findings:  None. LEFT LOWER EXTREMITY Common Femoral Vein: No  evidence of thrombus. Normal compressibility, respiratory phasicity and response to augmentation. Saphenofemoral Junction: No evidence of thrombus. Normal compressibility and flow on color Doppler imaging. Profunda Femoral Vein: No evidence of thrombus. Normal compressibility and flow on color Doppler imaging. Femoral Vein: No evidence of thrombus. Normal compressibility, respiratory phasicity and response to augmentation. Popliteal Vein: No evidence of thrombus. Normal compressibility, respiratory phasicity and response to augmentation. Calf Veins: No evidence of thrombus. Normal compressibility and flow on color Doppler imaging. Other Findings:  None. IMPRESSION: No evidence of bilateral lower extremity deep venous thrombosis. Marliss Coots, MD [facility] Electronically Signed   By: Marliss Coots M.D.   On: 10/05/2021 12:55        Scheduled Meds:  azithromycin  250 mg Oral Daily   dextromethorphan-guaiFENesin  1 tablet Oral BID   enoxaparin (LOVENOX) injection  40 mg Subcutaneous Q24H   [START ON 10/07/2021] furosemide  20 mg Oral BID   pantoprazole  40 mg Oral Daily   [START ON 10/07/2021] predniSONE  20 mg Oral Q breakfast   senna-docusate  1 tablet Oral QHS   sodium chloride  1 g Oral BID WC   Continuous Infusions:   LOS: 2 days   Time spent: Greater than 50% of this time was spent in counseling, explanation of diagnosis, planning of further management, and coordination of care.   Voice Recognition Reubin [place] dictation  system was used to create this note, attempts have been made to correct errors. Please contact the author with questions and/or clarifications.   Albertine Grates, MD PhD FACP [facility]  Available via Epic secure chat 7am-7pm for nonurgent issues Please page for urgent issues To page the attending provider between 7A-7P or the covering provider during after hours 7P-7A, please log into the web site [email or web address] and access using universal [place] password for that web site. If you do not have the password, please call the hospital operator.    10/06/2021, 6:49 PM

## 2021-10-07 LAB — BASIC METABOLIC PANEL
Anion gap: 7 (ref 5–15)
BUN: 19 mg/dL (ref 8–23)
CO2: 30 mmol/L (ref 22–32)
Calcium: 8.9 mg/dL (ref 8.9–10.3)
Chloride: 86 mmol/L — ABNORMAL LOW (ref 98–111)
Creatinine, Ser: 0.81 mg/dL (ref 0.44–1.00)
GFR, Estimated: >60 mL/min (ref >60)
Glucose, Bld: 102 mg/dL — ABNORMAL HIGH (ref 70–99)
Potassium: 3.9 mmol/L (ref 3.5–5.1)
Sodium: 123 mmol/L — ABNORMAL LOW (ref 135–145)

## 2021-10-07 MED ORDER — ALBUTEROL SULFATE HFA 108 (90 BASE) MCG/ACT IN AERS: 2.0000 | INHALATION_SPRAY | RESPIRATORY_TRACT | 1 refills | Status: DC | PRN

## 2021-10-07 MED ORDER — FUROSEMIDE 20 MG PO TABS: 20.0000 mg | ORAL_TABLET | Freq: Two times a day (BID) | ORAL | 0 refills | Status: DC

## 2021-10-07 MED ORDER — POTASSIUM CHLORIDE CRYS ER 10 MEQ PO TBCR: 10.0000 meq | EXTENDED_RELEASE_TABLET | Freq: Every day | ORAL | 0 refills | Status: DC

## 2021-10-07 MED ORDER — PREDNISONE 20 MG PO TABS: 20.0000 mg | ORAL_TABLET | Freq: Every day | ORAL | 0 refills | Status: AC

## 2021-10-07 MED ORDER — AZITHROMYCIN 250 MG PO TABS: 250.0000 mg | ORAL_TABLET | Freq: Every day | ORAL | 0 refills | Status: AC

## 2021-10-07 MED ORDER — SODIUM CHLORIDE 1 G PO TABS: 1.0000 g | ORAL_TABLET | Freq: Two times a day (BID) | ORAL | 0 refills | Status: AC

## 2021-10-07 NOTE — Discharge Instructions (Addendum)
PLEASE HAVE YOUR SODIUM RECHECKED WITH PCP IN 1 WEEK  IMPORTANT INFORMATION: PAY CLOSE ATTENTION   PHYSICIAN DISCHARGE INSTRUCTIONS  Follow with Primary care provider  Assunta Found, MD  and other consultants as instructed by your Hospitalist Physician  SEEK MEDICAL CARE OR RETURN TO EMERGENCY ROOM IF SYMPTOMS COME BACK, WORSEN OR NEW PROBLEM DEVELOPS   Please note: You were cared for by a hospitalist during your hospital stay. Every effort will be made to forward records to your primary care provider.  You can request that your primary care provider send for your hospital records if they have not received them.  Once you are discharged, your primary care physician will handle any further medical issues. Please note that NO REFILLS for any discharge medications will be authorized once you are discharged, as it is imperative that you return to your primary care physician (or establish a relationship with a primary care physician if you do not have one) for your post hospital discharge needs so that they can reassess your need for medications and monitor your lab values.  Please get a complete blood count and chemistry panel checked by your Primary MD at your next visit, and again as instructed by your Primary MD.  Get Medicines reviewed and adjusted: Please take all your medications with you for your next visit with your Primary MD  Laboratory/radiological data: Please request your Primary MD to go over all hospital tests and procedure/radiological results at the follow up, please ask your primary care provider to get all Hospital records sent to his/her office.  In some cases, they will be blood work, cultures and biopsy results pending at the time of your discharge. Please request that your primary care provider follow up on these results.  If you are diabetic, please bring your blood sugar readings with you to your follow up appointment with primary care.    Please call and make your  follow up appointments as soon as possible.    Also Note the following: If you experience worsening of your admission symptoms, develop shortness of breath, life threatening emergency, suicidal or homicidal thoughts you must seek medical attention immediately by calling 911 or calling your MD immediately  if symptoms less severe.  You must read complete instructions/literature along with all the possible adverse reactions/side effects for all the Medicines you take and that have been prescribed to you. Take any new Medicines after you have completely understood and accpet all the possible adverse reactions/side effects.   Do not drive when taking Pain medications or sleeping medications (Benzodiazepines)  Do not take more than prescribed Pain, Sleep and Anxiety Medications. It is not advisable to combine anxiety,sleep and pain medications without talking with your primary care practitioner  Special Instructions: If you have smoked or chewed Tobacco  in the last 2 yrs please stop smoking, stop any regular Alcohol  and or any Recreational drug use.  Wear Seat belts while driving.  Do not drive if taking any narcotic, mind altering or controlled substances or recreational drugs or alcohol.

## 2021-10-07 NOTE — Discharge Summary (Signed)
Physician Discharge Summary  TA FAIR ZOX:[id number] DOB: 02/05/1956 DOA: 10/03/2021  PCP: Assunta Found, MD  Admit date: 10/03/2021 Discharge date: 10/07/2021  Admitted From:  Home  Disposition: Home   Recommendations for Outpatient Follow-up:  Follow up with PCP in 1 weeks Please obtain BMP in one week and decide if she needs to continue lasix/potassium.   Establish care with endocrinology to be evaluated for possible adrenal insufficiency.   Discharge Condition: STABLE   CODE STATUS: FULL DIET: resume home diet    Brief Hospitalization Summary: Please see all hospital notes, images, labs for full details of the hospitalization. ADMISSION HPI: Cynthia [name] is a 65 y.o. female with medical history significant for hypertension, COPD, tobacco abuse who presents to the emergency department due to confusion.  Patient was admitted on 10/23 due to acute hyponatremia and COPD exacerbation with possible  acquired pneumonia.  However, she left AMA yesterday (10/24) AGAINST MEDICAL ADVICE.  She returned today complaining of intermittent confusion, so she thought it may be due to her low sodium level.  She complained of cough which was intermittently productive, the cough was associated with chest pain.  She denies fever, chills, nausea, vomiting, abdominal pain.   ED Course:  In the emergency department, she was hemodynamically stable.  Work-up in the ED showed hyponatremia with sodium of 118, potassium 4.2, chloride 82, CO2 28, glucose 1 1, BUN 12, creatinine 0.63.  CBC was normal except for WBC at 12.2.  Serum osmolality at 251.  Chest x-ray showed no acute process in the chest.  IV hydration was provided.  Hospitalist was asked to admit patient for further evaluation and management.  HOSPITAL COURSE BY PROBLEM LIST  COPD exacerbation - SYMPTOMS MUCH IMPROVED  -Tested negative for COVID-19/influenza on 10/23 -treated with  IV steroid, DuoNeb, antibiotics -improving, weaned oxygen  to room air,  5 more days of weaned down prednisone then stop   Hyponatremia - IMPROVING  Appear acute on chronic She does take HCTZ at home, which is discontinued and patient told to not restart.  Difficult to assess volume status, she does has bilateral lower extremity edema left worse than the right Serum osmolality 251, urine osmolality in the 300, urine sodium 61 on 10/26, it appeared that she is on HCTZ at home also received fluids on admission not sure urine studies is reliable TSH unremarkable,   Noted in hospital to have low a.m. cortisol level  while taking steroids for COPD, not able to do cosyntropin stim test while on steroid.  Ambulatory referral to endocrinology for outpatient cosyntropin stim test once off steroids uric acid 3.7   Case discussed with nephrology Dr. Hyman Hopes who recommend fluids restriction, salt tablet supplement, Lasix 20 mg IV daily x2 then oral lasix  bid as likely have underlying SIADH.  Pt insists on discharging home today.  She says she will follow up with her PCP next week for repeat BMP test.  She is totally asymptomatic from her hyponatremia which means this has been chronic for her.  DC home today on lasix 20 mg BID x 1 week, Kdur 10 mg daily x 1 week, Follow up with PCP next week to have repeat BMP tested and to decide if she needs to continue the lasix and potassium.    Bilateral lower extremity pitting edema, left > right venous doppler no DVT Elevate legs  Follow up with PCP   HTN Stable without home meds, Discontinue Benicar/HCTZ per nephrology recommendation, as both ARB's and  HCTZ can cause hyponatremia Today's Vitals   10/06/21 2111 10/06/21 2150 10/07/21 0614 10/07/21 0912  BP: 119/70  118/73   Pulse: 66  (!) 56   Resp: 20  18   Temp: 98.3 F (36.8 C)  98.3 F (36.8 C)   TempSrc: Oral  Oral   SpO2: 95%  96%   Weight:      Height:      PainSc:  0-No pain  0-No pain   Body mass index is 34.11 kg/m.    DVT prophylaxis: enoxaparin  (LOVENOX) injection 40 mg Start: 10/04/21 1000 SCDs Start: 10/03/21 2234     Code Status:full Family Communication: patient  Disposition:  HOME   Discharge Diagnoses:  Principal Problem:   Hyponatremia Active Problems:   COPD with acute exacerbation (HCC)   Leukocytosis   Obesity   Confusion   Acute on chronic respiratory failure with hypoxia and hypercapnia [facility])   Discharge Instructions: Discharge Instructions     Ambulatory referral to Endocrinology   Complete by: As directed       Allergies as of 10/07/2021   No Known Allergies      Medication List     STOP taking these medications    olmesartan-hydrochlorothiazide 40-25 MG tablet Commonly known as: BENICAR HCT       TAKE these medications    acetaminophen 325 MG tablet Commonly known as: TYLENOL Take 650 mg by mouth every 6 (six) hours as needed for mild pain, moderate pain, fever or headache.   albuterol 108 (90 Base) MCG/ACT inhaler Commonly known as: VENTOLIN HFA Inhale 2 puffs into the lungs every 4 (four) hours as needed for wheezing or shortness of breath (cough, shortness of breath or wheezing.).   aspirin EC 81 MG tablet Take 81 mg by mouth daily.   azithromycin 250 MG tablet Commonly known as: ZITHROMAX Take 1 tablet (250 mg total) by mouth daily for 1 day. Start taking on: October 08, 2021   fenofibrate 145 MG tablet Commonly known as: TRICOR Take 145 mg by mouth daily.   furosemide 20 MG tablet Commonly known as: LASIX Take 1 tablet (20 mg total) by mouth 2 (two) times daily for 7 days.   potassium chloride 10 MEQ tablet Commonly known as: KLOR-CON Take 1 tablet (10 mEq total) by mouth daily. Take only while taking lasix.   predniSONE 20 MG tablet Commonly known as: DELTASONE Take 1 tablet (20 mg total) by mouth daily with breakfast for 5 days. Start taking on: October 08, 2021   sodium chloride 1 g tablet Take 1 tablet (1 g total) by mouth 2 (two) times daily with a meal  for 7 days.        Follow-up Information     Assunta Found, MD Follow up in 1 week(s).   Specialty: Family Medicine Why: hospital discharge follow up, repeat sodium level at follow up. pcp to refer to endocrinology for adrenal gland function test Contact information: [address] [place] [address] [phone]                No Known Allergies Allergies as of 10/07/2021   No Known Allergies      Medication List     STOP taking these medications    olmesartan-hydrochlorothiazide 40-25 MG tablet Commonly known as: BENICAR HCT       TAKE these medications    acetaminophen 325 MG tablet Commonly known as: TYLENOL Take 650 mg by mouth every 6 (six) hours as needed  for mild pain, moderate pain, fever or headache.   albuterol 108 (90 Base) MCG/ACT inhaler Commonly known as: VENTOLIN HFA Inhale 2 puffs into the lungs every 4 (four) hours as needed for wheezing or shortness of breath (cough, shortness of breath or wheezing.).   aspirin EC 81 MG tablet Take 81 mg by mouth daily.   azithromycin 250 MG tablet Commonly known as: ZITHROMAX Take 1 tablet (250 mg total) by mouth daily for 1 day. Start taking on: October 08, 2021   fenofibrate 145 MG tablet Commonly known as: TRICOR Take 145 mg by mouth daily.   furosemide 20 MG tablet Commonly known as: LASIX Take 1 tablet (20 mg total) by mouth 2 (two) times daily for 7 days.   potassium chloride 10 MEQ tablet Commonly known as: KLOR-CON Take 1 tablet (10 mEq total) by mouth daily. Take only while taking lasix.   predniSONE 20 MG tablet Commonly known as: DELTASONE Take 1 tablet (20 mg total) by mouth daily with breakfast for 5 days. Start taking on: October 08, 2021   sodium chloride 1 g tablet Take 1 tablet (1 g total) by mouth 2 (two) times daily with a meal for 7 days.        Procedures/Studies: DG Chest 2 View  Result Date: 10/03/2021 CLINICAL DATA:  Chest pain EXAM: CHEST - 2  VIEW COMPARISON:  10/01/2021 FINDINGS: The heart size and mediastinal contours are within normal limits. Chronic interstitial changes. No new consolidation. No pleural effusion or pneumothorax. The visualized skeletal structures are unremarkable. IMPRESSION: No acute process in the chest. Electronically Signed   By: Guadlupe Spanish M.D.   On: 10/03/2021 20:31   US Venous Img Lower Bilateral (DVT)  Result Date: 10/05/2021 CLINICAL DATA:  65 year old female with history of lower extremity edema. EXAM: BILATERAL LOWER EXTREMITY VENOUS DOPPLER ULTRASOUND TECHNIQUE: Gray-scale sonography with graded compression, as well as color Doppler and duplex ultrasound were performed to evaluate the lower extremity deep venous systems from the level of the common femoral vein and including the common femoral, femoral, profunda femoral, popliteal and calf veins including the posterior tibial, peroneal and gastrocnemius veins when visible. The superficial great saphenous vein was also interrogated. Spectral Doppler was utilized to evaluate flow at rest and with distal augmentation maneuvers in the common femoral, femoral and popliteal veins. COMPARISON:  None. FINDINGS: RIGHT LOWER EXTREMITY Common Femoral Vein: No evidence of thrombus. Normal compressibility, respiratory phasicity and response to augmentation. Saphenofemoral Junction: No evidence of thrombus. Normal compressibility and flow on color Doppler imaging. Profunda Femoral Vein: No evidence of thrombus. Normal compressibility and flow on color Doppler imaging. Femoral Vein: No evidence of thrombus. Normal compressibility, respiratory phasicity and response to augmentation. Popliteal Vein: No evidence of thrombus. Normal compressibility, respiratory phasicity and response to augmentation. Calf Veins: No evidence of thrombus. Normal compressibility and flow on color Doppler imaging. Other Findings:  None. LEFT LOWER EXTREMITY Common Femoral Vein: No evidence of thrombus.  Normal compressibility, respiratory phasicity and response to augmentation. Saphenofemoral Junction: No evidence of thrombus. Normal compressibility and flow on color Doppler imaging. Profunda Femoral Vein: No evidence of thrombus. Normal compressibility and flow on color Doppler imaging. Femoral Vein: No evidence of thrombus. Normal compressibility, respiratory phasicity and response to augmentation. Popliteal Vein: No evidence of thrombus. Normal compressibility, respiratory phasicity and response to augmentation. Calf Veins: No evidence of thrombus. Normal compressibility and flow on color Doppler imaging. Other Findings:  None. IMPRESSION: No evidence of bilateral lower extremity deep venous  thrombosis. Marliss Coots, MD [facility] Electronically Signed   By: Marliss Coots M.D.   On: 10/05/2021 12:55   DG Chest Port 1 View  Result Date: 10/01/2021 CLINICAL DATA:  Cough and chest congestion. EXAM: PORTABLE CHEST 1 VIEW COMPARISON:  Radiograph 12/21/2019, remote chest CT 10/07/2012 reviewed FINDINGS: The cardiomediastinal contours are normal. There is slight interstitial coarsening that appears chronic. Subsegmental atelectasis at the left lung base. No confluent airspace disease. Pulmonary vasculature is normal. No pleural effusion or pneumothorax. No acute osseous abnormalities are seen. IMPRESSION: Subsegmental atelectasis at the left lung base. Chronic interstitial coarsening likely related smoking. Electronically Signed   By: Narda Rutherford M.D.   On: 10/01/2021 17:39     Subjective: Pt says she insists to go home today.  She says she is having a difficult time staying in the hospital. She says she will follow up with her PCP next week and have repeat labs done.    Discharge Exam: Vitals:   10/06/21 2111 10/07/21 0614  BP: 119/70 118/73  Pulse: 66 (!) 56  Resp: 20 18  Temp: 98.3 F (36.8 C) 98.3 F (36.8 C)  SpO2: 95% 96%    Vitals:   10/06/21 0528 10/06/21 1309 10/06/21 2111 10/07/21 0614  BP: 115/72 116/71 119/70 118/73  Pulse: (!) 59 62 66 (!) 56  Resp: 16 17 20 18   Temp: 98.3 F (36.8 C) 98.6 F (37 C) 98.3 F (36.8 C) 98.3 F (36.8 C)  TempSrc: Oral Oral Oral Oral  SpO2: 93% 94% 95% 96%  Weight:      Height:       General: Pt is alert, awake, not in acute distress Cardiovascular: RRR, S1/S2 +, no rubs, no gallops Respiratory: CTA bilaterally, no wheezing, no rhonchi Abdominal: Soft, NT, ND, bowel sounds + Extremities: no edema, no cyanosis   The results of significant diagnostics from this hospitalization (including imaging, microbiology, ancillary and laboratory) are listed below for reference.     Microbiology: Recent Results (from the past 240 hour(s))  Resp Panel by RT-PCR (Flu A&B, Covid) Nasopharyngeal Swab     Status: None   Collection Time: 10/01/21  5:22 PM   Specimen: Nasopharyngeal Swab; Nasopharyngeal(NP) swabs in vial transport medium  Result Value Ref Range Status   SARS Coronavirus 2 by RT PCR NEGATIVE NEGATIVE Final    Comment: (NOTE) SARS-CoV-2 target nucleic acids are NOT DETECTED.  The SARS-CoV-2 RNA is generally detectable in upper respiratory specimens during the acute phase of infection. The lowest concentration of SARS-CoV-2 viral copies this assay can detect is 138 copies/mL. A negative result does not preclude SARS-Cov-2 infection and should not be used as the sole basis for treatment or other patient management decisions. A negative result may occur with  improper specimen collection/handling, submission of specimen other than nasopharyngeal swab, presence of viral mutation(s) within the areas targeted by this assay, and inadequate number of viral copies(<138 copies/mL). A negative result must be combined with clinical observations, patient history, and epidemiological information. The expected result is Negative.  Fact Sheet for Patients:   10/03/21  Fact Sheet for Healthcare Providers:  BloggerCourse.com  This test is no t yet approved or cleared by the [place] FDA and  has been authorized for detection and/or diagnosis of SARS-CoV-2 by FDA under an Emergency Use Authorization (EUA). This EUA will remain  in effect (meaning this test can be used) for the duration of the COVID-19 declaration under Section 564(b)(1) of  the Act, 21 U.S.C.section 360bbb-3(b)(1), unless the authorization is terminated  or revoked sooner.       Influenza A by PCR NEGATIVE NEGATIVE Final   Influenza B by PCR NEGATIVE NEGATIVE Final    Comment: (NOTE) The Xpert Xpress SARS-CoV-2/FLU/RSV plus assay is intended as an aid in the diagnosis of influenza from Nasopharyngeal swab specimens and should not be used as a sole basis for treatment. Nasal washings and aspirates are unacceptable for Xpert Xpress SARS-CoV-2/FLU/RSV testing.  Fact Sheet for Patients: BloggerCourse.com  Fact Sheet for Healthcare Providers: SeriousBroker.it  This test is not yet approved or cleared by the [place] FDA and has been authorized for detection and/or diagnosis of SARS-CoV-2 by FDA under an Emergency Use Authorization (EUA). This EUA will remain in effect (meaning this test can be used) for the duration of the COVID-19 declaration under Section 564(b)(1) of the Act, 21 U.S.C. section 360bbb-3(b)(1), unless the authorization is terminated or revoked.  Performed at [facility], [address], [place], [place] [address]      Labs: BNP (last 3 results) Recent Labs    10/01/21 1650  BNP 12.0   Basic Metabolic Panel: Recent Labs  Lab 10/04/21 0406 10/04/21 1052 10/05/21 0540 10/06/21 0454 10/07/21 0402  NA 121* 119* 119* 121* 123*  K 4.2 4.4 4.7 4.3 3.9  CL 87* 86* 88* 84* 86*  CO2 28 26 26 30 30   GLUCOSE 89 136* 103* 74 102*   BUN 12 14 12 15 19   CREATININE 0.68 0.68 0.59 0.69 0.81  CALCIUM 8.2* 8.5* 8.2* 8.8* 8.9  MG 1.9  --   --  1.9  --   PHOS 2.9  --   --   --   --    Liver Function Tests: Recent Labs  Lab 10/04/21 0406  AST 40  ALT 28  ALKPHOS 37*  BILITOT 0.7  PROT 6.0*  ALBUMIN 3.5   No results for input(s): LIPASE, AMYLASE in the last 168 hours. No results for input(s): AMMONIA in the last 168 hours. CBC: Recent Labs  Lab 10/01/21 1650 10/02/21 0413 10/03/21 2007 10/04/21 0406  WBC 13.8* 14.2* 12.2* 10.7*  NEUTROABS 9.8*  --   --   --   HGB 14.0 14.4 14.4 13.4  HCT 39.8 40.8 40.8 38.7  MCV 90.0 89.5 90.7 91.7  PLT 279 305 290 265   Cardiac Enzymes: No results for input(s): CKTOTAL, CKMB, CKMBINDEX, TROPONINI in the last 168 hours. BNP: Invalid input(s): POCBNP CBG: No results for input(s): GLUCAP in the last 168 hours. D-Dimer No results for input(s): DDIMER in the last 72 hours. Hgb A1c No results for input(s): HGBA1C in the last 72 hours. Lipid Profile No results for input(s): CHOL, HDL, LDLCALC, TRIG, CHOLHDL, LDLDIRECT in the last 72 hours. Thyroid function studies No results for input(s): TSH, T4TOTAL, T3FREE, THYROIDAB in the last 72 hours.  Invalid input(s): FREET3 Anemia work up No results for input(s): VITAMINB12, FOLATE, FERRITIN, TIBC, IRON, RETICCTPCT in the last 72 hours. Urinalysis    Component Value Date/Time   COLORURINE YELLOW 10/01/2021 1806   APPEARANCEUR HAZY (A) 10/01/2021 1806   LABSPEC 1.010 10/01/2021 1806   PHURINE 6.0 10/01/2021 1806   GLUCOSEU NEGATIVE 10/01/2021 1806   HGBUR SMALL (A) 10/01/2021 1806   BILIRUBINUR NEGATIVE 10/01/2021 1806   KETONESUR NEGATIVE 10/01/2021 1806   PROTEINUR NEGATIVE 10/01/2021 1806   NITRITE NEGATIVE 10/01/2021 1806   LEUKOCYTESUR MODERATE (A) 10/01/2021 1806   Sepsis Labs Invalid input(s): PROCALCITONIN,  WBC,  LACTICIDVEN Microbiology Recent Results (from the past 240 hour(s))  Resp Panel by RT-PCR  (Flu A&B, Covid) Nasopharyngeal Swab     Status: None   Collection Time: 10/01/21  5:22 PM   Specimen: Nasopharyngeal Swab; Nasopharyngeal(NP) swabs in vial transport medium  Result Value Ref Range Status   SARS Coronavirus 2 by RT PCR NEGATIVE NEGATIVE Final    Comment: (NOTE) SARS-CoV-2 target nucleic acids are NOT DETECTED.  The SARS-CoV-2 RNA is generally detectable in upper respiratory specimens during the acute phase of infection. The lowest concentration of SARS-CoV-2 viral copies this assay can detect is 138 copies/mL. A negative result does not preclude SARS-Cov-2 infection and should not be used as the sole basis for treatment or other patient management decisions. A negative result may occur with  improper specimen collection/handling, submission of specimen other than nasopharyngeal swab, presence of viral mutation(s) within the areas targeted by this assay, and inadequate number of viral copies(<138 copies/mL). A negative result must be combined with clinical observations, patient history, and epidemiological information. The expected result is Negative.  Fact Sheet for Patients:  BloggerCourse.com  Fact Sheet for Healthcare Providers:  SeriousBroker.it  This test is no t yet approved or cleared by the [place] FDA and  has been authorized for detection and/or diagnosis of SARS-CoV-2 by FDA under an Emergency Use Authorization (EUA). This EUA will remain  in effect (meaning this test can be used) for the duration of the COVID-19 declaration under Section 564(b)(1) of the Act, 21 U.S.C.section 360bbb-3(b)(1), unless the authorization is terminated  or revoked sooner.       Influenza A by PCR NEGATIVE NEGATIVE Final   Influenza B by PCR NEGATIVE NEGATIVE Final    Comment: (NOTE) The Xpert Xpress SARS-CoV-2/FLU/RSV plus assay is intended as an aid in the diagnosis of influenza from Nasopharyngeal swab specimens  and should not be used as a sole basis for treatment. Nasal washings and aspirates are unacceptable for Xpert Xpress SARS-CoV-2/FLU/RSV testing.  Fact Sheet for Patients: BloggerCourse.com  Fact Sheet for Healthcare Providers: SeriousBroker.it  This test is not yet approved or cleared by the [place] FDA and has been authorized for detection and/or diagnosis of SARS-CoV-2 by FDA under an Emergency Use Authorization (EUA). This EUA will remain in effect (meaning this test can be used) for the duration of the COVID-19 declaration under Section 564(b)(1) of the Act, 21 U.S.C. section 360bbb-3(b)(1), unless the authorization is terminated or revoked.  Performed at [facility], [address], [place], [place] [address]    Time coordinating discharge: 35 mins   SIGNED:  Standley Dakins, MD  [facility] 10/07/2021, 11:54 AM How to contact the [facility] Attending or Consulting provider 7A - 7P or covering provider during after hours 7P -7A, for this patient?  Check the care team in [facility] and look for a) attending/consulting [facility] provider listed and b) the [facility] team listed Log into [email or web address] and use [place]'s universal password to access. If you do not have the password, please contact the hospital operator. Locate the [facility] provider you are looking for under [facility] and page to a number that you can be directly reached. If you still have difficulty reaching the provider, please page the [facility] (Director on Call) for the Hospitalists listed on amion for assistance.

## 2021-10-10 DIAGNOSIS — E871 Hypo-osmolality and hyponatremia: Secondary | ICD-10-CM | POA: Diagnosis not present

## 2021-10-10 DIAGNOSIS — E2740 Unspecified adrenocortical insufficiency: Secondary | ICD-10-CM | POA: Diagnosis not present

## 2021-10-10 DIAGNOSIS — Z6832 Body mass index (BMI) 32.0-32.9, adult: Secondary | ICD-10-CM | POA: Diagnosis not present

## 2021-10-10 DIAGNOSIS — J189 Pneumonia, unspecified organism: Secondary | ICD-10-CM | POA: Diagnosis not present

## 2022-02-19 DIAGNOSIS — J449 Chronic obstructive pulmonary disease, unspecified: Secondary | ICD-10-CM | POA: Diagnosis not present

## 2022-02-19 DIAGNOSIS — E2740 Unspecified adrenocortical insufficiency: Secondary | ICD-10-CM | POA: Diagnosis not present

## 2022-02-19 DIAGNOSIS — R7309 Other abnormal glucose: Secondary | ICD-10-CM | POA: Diagnosis not present

## 2022-02-19 DIAGNOSIS — E785 Hyperlipidemia, unspecified: Secondary | ICD-10-CM | POA: Diagnosis not present

## 2022-02-19 DIAGNOSIS — Z0001 Encounter for general adult medical examination with abnormal findings: Secondary | ICD-10-CM | POA: Diagnosis not present

## 2022-02-19 DIAGNOSIS — F17290 Nicotine dependence, other tobacco product, uncomplicated: Secondary | ICD-10-CM | POA: Diagnosis not present

## 2022-02-19 DIAGNOSIS — I10 Essential (primary) hypertension: Secondary | ICD-10-CM | POA: Diagnosis not present

## 2022-02-20 ENCOUNTER — Other Ambulatory Visit (HOSPITAL_COMMUNITY): Payer: Self-pay | Admitting: Family Medicine

## 2022-02-20 DIAGNOSIS — Z1231 Encounter for screening mammogram for malignant neoplasm of breast: Secondary | ICD-10-CM

## 2022-03-20 ENCOUNTER — Emergency Department (HOSPITAL_COMMUNITY): Payer: Medicare Other

## 2022-03-20 ENCOUNTER — Encounter (HOSPITAL_COMMUNITY): Payer: Self-pay | Admitting: *Deleted

## 2022-03-20 ENCOUNTER — Emergency Department (HOSPITAL_COMMUNITY)
Admission: EM | Admit: 2022-03-20 | Discharge: 2022-03-20 | Disposition: A | Discharge status: Home / Self Care | Payer: Medicare Other | Attending: Emergency Medicine | Admitting: Emergency Medicine

## 2022-03-20 DIAGNOSIS — M545 Low back pain, unspecified: Secondary | ICD-10-CM | POA: Diagnosis not present

## 2022-03-20 DIAGNOSIS — J45909 Unspecified asthma, uncomplicated: Secondary | ICD-10-CM | POA: Diagnosis not present

## 2022-03-20 DIAGNOSIS — M1611 Unilateral primary osteoarthritis, right hip: Secondary | ICD-10-CM | POA: Insufficient documentation

## 2022-03-20 DIAGNOSIS — Z7952 Long term (current) use of systemic steroids: Secondary | ICD-10-CM | POA: Insufficient documentation

## 2022-03-20 DIAGNOSIS — L539 Erythematous condition, unspecified: Secondary | ICD-10-CM | POA: Insufficient documentation

## 2022-03-20 DIAGNOSIS — M25751 Osteophyte, right hip: Secondary | ICD-10-CM | POA: Diagnosis not present

## 2022-03-20 DIAGNOSIS — Z7982 Long term (current) use of aspirin: Secondary | ICD-10-CM | POA: Diagnosis not present

## 2022-03-20 DIAGNOSIS — M5431 Sciatica, right side: Secondary | ICD-10-CM

## 2022-03-20 DIAGNOSIS — M5441 Lumbago with sciatica, right side: Secondary | ICD-10-CM | POA: Diagnosis not present

## 2022-03-20 DIAGNOSIS — I10 Essential (primary) hypertension: Secondary | ICD-10-CM | POA: Diagnosis not present

## 2022-03-20 MED ORDER — PREDNISONE 50 MG PO TABS
60.0000 mg | ORAL_TABLET | Freq: Once | ORAL | Status: AC
  Administered 2022-03-20: 60 mg via ORAL
  Filled 2022-03-20: qty 1

## 2022-03-20 MED ORDER — LIDOCAINE 5 % EX PTCH
1.0000 | MEDICATED_PATCH | CUTANEOUS | Status: DC
  Administered 2022-03-20: 1 via TRANSDERMAL
  Filled 2022-03-20: qty 1

## 2022-03-20 MED ORDER — METHOCARBAMOL 500 MG PO TABS: 500.0000 mg | ORAL_TABLET | Freq: Two times a day (BID) | ORAL | 0 refills | Status: DC

## 2022-03-20 MED ORDER — PREDNISONE 10 MG PO TABS: ORAL_TABLET | ORAL | 0 refills | Status: DC

## 2022-03-20 MED ORDER — LIDOCAINE 5 % EX PTCH
1.0000 | MEDICATED_PATCH | CUTANEOUS | 0 refills | Status: DC
Start: 2022-03-20 — End: 2023-04-24

## 2022-03-20 NOTE — ED Provider Notes (Signed)
?[facility] ?Provider Note ? ? ?CSN: [id number] ?Arrival date & time: 03/20/22  1154 ? ?  ? ?History ? ?Chief Complaint  ?Patient presents with  ? Back Pain  ? ? ?Cynthia [name] is a 66 y.o. female with a history including hypertension, asthma and intermittent problems with low back pain presenting for an exacerbation of her low back pain which is been present for the past 3 weeks.  She has 2 separate complaints which she states may be related, the first began pain in her right buttock region which radiates down her right leg, describes a burning sensation to her mid lower leg at its worst.  Pain is worse with movement and certain positions.  She also reports having pain in her right groin region which is worsened when she first wakes in the morning, describes stiffness in her hip joint and difficulty ambulating for the first few minutes of her day.  She denies any new injuries.  She denies weakness in her extremities and has had no urinary or fecal incontinence or retention.  She denies abdominal pain or distention.  No fevers or chills.  She does take Tylenol most afternoons which she states does give her some relief of discomfort.  She has found no other alleviators for her symptoms. ? ?The history is provided by the patient.  ? ?  ? ?Home Medications ?Prior to Admission medications   ?Medication Sig Start Date End Date Taking? Authorizing Provider  ?lidocaine (LIDODERM) 5 % Place 1 patch onto the skin daily. Remove & Discard patch within 12 hours or as directed by MD 03/20/22  Yes Jakaiden Fill, Almyra Free, PA-C  ?methocarbamol (ROBAXIN) 500 MG tablet Take 1 tablet (500 mg total) by mouth 2 (two) times daily. 03/20/22  Yes Krish Bailly, Almyra Free, PA-C  ?predniSONE (DELTASONE) 10 MG tablet 6, 5, 4, 3, 2 then 1 tablet by mouth daily for 6 days total. 03/20/22  Yes Marnie Fazzino, Almyra Free, PA-C  ?acetaminophen (TYLENOL) 325 MG tablet Take 650 mg by mouth every 6 (six) hours as needed for mild pain, moderate pain, fever or headache.     [provider]  ?albuterol (VENTOLIN HFA) 108 (90 Base) MCG/ACT inhaler Inhale 2 puffs into the lungs every 4 (four) hours as needed for wheezing or shortness of breath (cough, shortness of breath or wheezing.). 10/07/21   Murlean Iba, MD  ?aspirin EC 81 MG tablet Take 81 mg by mouth daily.    [provider]  ?fenofibrate (TRICOR) 145 MG tablet Take 145 mg by mouth daily.    [provider]  ?furosemide (LASIX) 20 MG tablet Take 1 tablet (20 mg total) by mouth 2 (two) times daily for 7 days. 10/07/21 10/14/21  Johnson, Clanford L, MD  ?potassium chloride SA (KLOR-CON) 10 MEQ tablet Take 1 tablet (10 mEq total) by mouth daily. Take only while taking lasix. 10/07/21   Murlean Iba, MD  ?   ? ?Allergies    ?Patient has no known allergies.   ? ?Review of Systems   ?Review of Systems  ?Constitutional:  Negative for fever.  ?Respiratory:  Negative for shortness of breath.   ?Cardiovascular:  Negative for chest pain and leg swelling.  ?Gastrointestinal:  Negative for abdominal distention, abdominal pain and constipation.  ?Genitourinary:  Negative for difficulty urinating, dysuria, flank pain, frequency and urgency.  ?Musculoskeletal:  Positive for arthralgias and back pain. Negative for gait problem, joint swelling and myalgias.  ?Skin:  Negative for rash.  ?Neurological:  Negative  for weakness and numbness.  ? ?Physical Exam ?Updated Vital Signs ?BP 140/88 (BP Location: Left Arm)   Pulse 68   Temp 97.9 ?F (36.6 ?C) (Oral)   Resp 18   SpO2 96%  ?Physical Exam ?Vitals and nursing note reviewed.  ?Constitutional:   ?   Appearance: She is well-developed.  ?HENT:  ?   Head: Normocephalic.  ?Eyes:  ?   Conjunctiva/sclera: Conjunctivae normal.  ?Cardiovascular:  ?   Rate and Rhythm: Normal rate.  ?   Pulses: Normal pulses.  ?   Comments: Pedal pulses normal. ?Pulmonary:  ?   Effort: Pulmonary effort is normal.  ?Abdominal:  ?   General: Bowel sounds are normal. There is no  distension.  ?   Palpations: Abdomen is soft. There is no mass.  ?Musculoskeletal:     ?   General: Normal range of motion.  ?   Cervical back: Normal range of motion and neck supple.  ?   Lumbar back: Tenderness present. No swelling, edema or spasms.  ?Skin: ?   General: Skin is warm and dry.  ?   Findings: Erythema present.  ?   Comments: Bilateral lower extremity hyperemia consistent with chronic venous stasis.  Pedal pulses are intact.  ?Neurological:  ?   Mental Status: She is alert.  ?   Sensory: No sensory deficit.  ?   Motor: No tremor or atrophy.  ?   Gait: Gait normal.  ?   Deep Tendon Reflexes:  ?   Reflex Scores: ?     Patellar reflexes are 2+ on the right side and 2+ on the left side. ?   Comments: No strength deficit noted in hip and knee flexor and extensor muscle groups.  Ankle flexion and extension intact.  ? ? ?ED Results / Procedures / Treatments   ?Labs ?(all labs ordered are listed, but only abnormal results are displayed) ?Labs Reviewed - No data to display ? ?EKG ?None ? ?Radiology ?DG Lumbar Spine Complete ? ?Result Date: 03/20/2022 ?CLINICAL DATA:  66 year old female with pain EXAM: LUMBAR SPINE - COMPLETE 4+ VIEW COMPARISON:  None. FINDINGS: Lumbar Spine: Lumbar vertebral elements maintain normal alignment without evidence of anterolisthesis, retrolisthesis, subluxation. No acute fracture. Short pedicles of the lower lumbar spine compatible with congenital canal stenosis. Vertebral body heights maintained. Disc space narrowing present throughout the lumbar spine with early endplate changes and anterior osteophyte production. Degree of narrowing most pronounced at L5-S1. Vacuum disc phenomena is present spanning L2-S1. Oblique images demonstrate no displaced pars defect. Facet hypertrophy spans L3-S1, worst at the L5-S1 level. Vascular calcifications IMPRESSION: Negative for acute fracture or malalignment of the lumbar spine. Degenerative disc disease throughout the lumbar spine, with facet  disease worst in the lower lumbar levels, superimposed on congenital canal narrowing. Vascular calcifications. Electronically Signed   By: Corrie Mckusick D.O.   On: 03/20/2022 14:04  ? ?DG Hip Unilat W or Wo Pelvis 2-3 Views Right ? ?Result Date: 03/20/2022 ?CLINICAL DATA:  Lower back pain.  Chronic.  Radiates down right leg. EXAM: DG HIP (WITH OR WITHOUT PELVIS) 2-3V RIGHT COMPARISON:  None. FINDINGS: There is diffuse decreased bone mineralization. Mild bilateral sacroiliac subchondral sclerosis. Moderate superolateral right acetabular degenerative osteophytosis. No significant joint space narrowing of either femoroacetabular joint. No acute fracture or dislocation. Minimal right L4-5 disc space narrowing. Vascular calcifications are noted. IMPRESSION: Mild right femoroacetabular osteoarthritis. Electronically Signed   By: Yvonne Kendall M.D.   On: 03/20/2022 14:04   ? ?  Procedures ?Procedures  ? ? ?Medications Ordered in ED ?Medications  ?lidocaine (LIDODERM) 5 % 1 patch (1 patch Transdermal Patch Applied 03/20/22 1507)  ?predniSONE (DELTASONE) tablet 60 mg (60 mg Oral Given 03/20/22 1507)  ? ? ?ED Course/ Medical Decision Making/ A&P ?  ?                        ?Medical Decision Making ?Patient with acute on chronic low back pain along with focal right hip pain.  There is some radicular symptoms but no neurodeficits on exam or by history.  She has no midline lumbar tenderness to palpation.  Abdomen is soft and nontender, no pulsatile mass, doubt this represents a AAA.  Afebrile, doubt discitis or epidural abscess, again no midline tenderness.  Patient was prescribed Robaxin, prednisone taper, also offered Lidoderm patch which she was willing to try.  She may continue using her Tylenol as well.  Advise close follow-up with her PCP for recheck in a week if symptoms are not improving, return precautions were discussed for emergent symptom development. ? ?Amount and/or Complexity of Data Reviewed ?Radiology: ordered and  independent interpretation performed. ?   Details: Degenerative changes, no mass. ? ?Risk ?OTC drugs. ?Prescription drug management. ? ? ? ? ? ? ? ? ? ? ?Final Clinical Impression(s) / ED Diagnoses ?Final diagnos

## 2022-03-20 NOTE — ED Notes (Signed)
Patient transported to X-ray 

## 2022-03-20 NOTE — Discharge Instructions (Signed)
Take your next dose of prednisone tomorrow morning.  Use the the other medicines as directed.   Avoid lifting,  Bending,  Twisting or any other activity that worsens your pain but stay as active as your comfortably can without making your symptoms worse.  I also suggest applying a heating pad to your lower back and hip region for 20 minutes several times daily.  You should get rechecked if your symptoms are not better over the next week or you develop increased pain,  Weakness in your leg(s) or loss of bladder or bowel function - these are symptoms of a worsening condition. ? ?

## 2022-03-20 NOTE — ED Triage Notes (Signed)
Pain in right hip x 3 weeks ?

## 2022-03-30 DIAGNOSIS — M549 Dorsalgia, unspecified: Secondary | ICD-10-CM | POA: Diagnosis not present

## 2022-03-30 DIAGNOSIS — J449 Chronic obstructive pulmonary disease, unspecified: Secondary | ICD-10-CM | POA: Diagnosis not present

## 2022-03-30 DIAGNOSIS — J40 Bronchitis, not specified as acute or chronic: Secondary | ICD-10-CM | POA: Diagnosis not present

## 2022-04-30 ENCOUNTER — Ambulatory Visit (HOSPITAL_COMMUNITY)
Admission: RE | Admit: 2022-04-30 | Discharge: 2022-04-30 | Disposition: A | Discharge status: Home / Self Care | Payer: Medicare Other | Source: Ambulatory Visit | Attending: Family Medicine | Admitting: Family Medicine

## 2022-04-30 DIAGNOSIS — Z1231 Encounter for screening mammogram for malignant neoplasm of breast: Secondary | ICD-10-CM | POA: Diagnosis not present

## 2023-02-21 ENCOUNTER — Encounter (HOSPITAL_COMMUNITY): Payer: Self-pay | Admitting: Family Medicine

## 2023-02-21 DIAGNOSIS — E2740 Unspecified adrenocortical insufficiency: Secondary | ICD-10-CM | POA: Diagnosis not present

## 2023-02-21 DIAGNOSIS — E785 Hyperlipidemia, unspecified: Secondary | ICD-10-CM | POA: Diagnosis not present

## 2023-02-21 DIAGNOSIS — J449 Chronic obstructive pulmonary disease, unspecified: Secondary | ICD-10-CM | POA: Diagnosis not present

## 2023-02-21 DIAGNOSIS — E559 Vitamin D deficiency, unspecified: Secondary | ICD-10-CM | POA: Diagnosis not present

## 2023-02-21 DIAGNOSIS — Z0001 Encounter for general adult medical examination with abnormal findings: Secondary | ICD-10-CM | POA: Diagnosis not present

## 2023-02-21 DIAGNOSIS — F17290 Nicotine dependence, other tobacco product, uncomplicated: Secondary | ICD-10-CM | POA: Diagnosis not present

## 2023-02-21 DIAGNOSIS — R7309 Other abnormal glucose: Secondary | ICD-10-CM | POA: Diagnosis not present

## 2023-02-21 DIAGNOSIS — E871 Hypo-osmolality and hyponatremia: Secondary | ICD-10-CM | POA: Diagnosis not present

## 2023-02-21 DIAGNOSIS — I10 Essential (primary) hypertension: Secondary | ICD-10-CM | POA: Diagnosis not present

## 2023-03-25 ENCOUNTER — Other Ambulatory Visit (HOSPITAL_COMMUNITY): Payer: Self-pay | Admitting: Family Medicine

## 2023-03-25 DIAGNOSIS — R1900 Intra-abdominal and pelvic swelling, mass and lump, unspecified site: Secondary | ICD-10-CM | POA: Diagnosis not present

## 2023-03-25 DIAGNOSIS — E2740 Unspecified adrenocortical insufficiency: Secondary | ICD-10-CM

## 2023-03-25 DIAGNOSIS — Z0001 Encounter for general adult medical examination with abnormal findings: Secondary | ICD-10-CM

## 2023-03-25 DIAGNOSIS — R748 Abnormal levels of other serum enzymes: Secondary | ICD-10-CM

## 2023-03-25 DIAGNOSIS — F17200 Nicotine dependence, unspecified, uncomplicated: Secondary | ICD-10-CM

## 2023-03-27 ENCOUNTER — Institutional Professional Consult (permissible substitution): Payer: Federal, State, Local not specified - PPO | Admitting: Internal Medicine

## 2023-04-01 ENCOUNTER — Ambulatory Visit (HOSPITAL_COMMUNITY)
Admission: RE | Admit: 2023-04-01 | Discharge: 2023-04-01 | Disposition: A | Discharge status: Home / Self Care | Payer: Medicare Other | Source: Ambulatory Visit | Attending: Family Medicine | Admitting: Family Medicine

## 2023-04-01 DIAGNOSIS — Z0001 Encounter for general adult medical examination with abnormal findings: Secondary | ICD-10-CM | POA: Insufficient documentation

## 2023-04-01 DIAGNOSIS — E2740 Unspecified adrenocortical insufficiency: Secondary | ICD-10-CM | POA: Insufficient documentation

## 2023-04-01 DIAGNOSIS — F17200 Nicotine dependence, unspecified, uncomplicated: Secondary | ICD-10-CM | POA: Insufficient documentation

## 2023-04-09 ENCOUNTER — Ambulatory Visit (HOSPITAL_COMMUNITY)
Admission: RE | Admit: 2023-04-09 | Discharge: 2023-04-09 | Disposition: A | Discharge status: Home / Self Care | Payer: Medicare Other | Source: Ambulatory Visit | Attending: Family Medicine | Admitting: Family Medicine

## 2023-04-09 DIAGNOSIS — Z0001 Encounter for general adult medical examination with abnormal findings: Secondary | ICD-10-CM

## 2023-04-09 DIAGNOSIS — J439 Emphysema, unspecified: Secondary | ICD-10-CM | POA: Diagnosis not present

## 2023-04-09 DIAGNOSIS — R748 Abnormal levels of other serum enzymes: Secondary | ICD-10-CM | POA: Insufficient documentation

## 2023-04-09 DIAGNOSIS — R918 Other nonspecific abnormal finding of lung field: Secondary | ICD-10-CM | POA: Diagnosis not present

## 2023-04-09 DIAGNOSIS — R945 Abnormal results of liver function studies: Secondary | ICD-10-CM | POA: Diagnosis not present

## 2023-04-09 DIAGNOSIS — E2740 Unspecified adrenocortical insufficiency: Secondary | ICD-10-CM | POA: Diagnosis not present

## 2023-04-09 DIAGNOSIS — F17200 Nicotine dependence, unspecified, uncomplicated: Secondary | ICD-10-CM | POA: Insufficient documentation

## 2023-04-16 ENCOUNTER — Other Ambulatory Visit (HOSPITAL_COMMUNITY): Payer: Self-pay | Admitting: Family Medicine

## 2023-04-16 DIAGNOSIS — Z1231 Encounter for screening mammogram for malignant neoplasm of breast: Secondary | ICD-10-CM

## 2023-04-24 ENCOUNTER — Encounter (HOSPITAL_BASED_OUTPATIENT_CLINIC_OR_DEPARTMENT_OTHER): Payer: Self-pay

## 2023-04-24 DIAGNOSIS — D3502 Benign neoplasm of left adrenal gland: Secondary | ICD-10-CM | POA: Diagnosis not present

## 2023-04-24 DIAGNOSIS — K760 Fatty (change of) liver, not elsewhere classified: Secondary | ICD-10-CM | POA: Diagnosis not present

## 2023-04-24 DIAGNOSIS — K828 Other specified diseases of gallbladder: Secondary | ICD-10-CM | POA: Diagnosis not present

## 2023-04-25 ENCOUNTER — Ambulatory Visit (INDEPENDENT_AMBULATORY_CARE_PROVIDER_SITE_OTHER): Payer: Medicare Other | Admitting: Cardiovascular Disease

## 2023-04-25 ENCOUNTER — Encounter (HOSPITAL_BASED_OUTPATIENT_CLINIC_OR_DEPARTMENT_OTHER): Payer: Self-pay | Admitting: Cardiovascular Disease

## 2023-04-25 ENCOUNTER — Other Ambulatory Visit (HOSPITAL_COMMUNITY): Payer: Self-pay

## 2023-04-25 VITALS — BP 110/70 | HR 80 | Ht 65.0 in | Wt 206.3 lb

## 2023-04-25 DIAGNOSIS — E781 Pure hyperglyceridemia: Secondary | ICD-10-CM | POA: Diagnosis not present

## 2023-04-25 DIAGNOSIS — R0602 Shortness of breath: Secondary | ICD-10-CM

## 2023-04-25 DIAGNOSIS — Z720 Tobacco use: Secondary | ICD-10-CM

## 2023-04-25 DIAGNOSIS — Z5181 Encounter for therapeutic drug level monitoring: Secondary | ICD-10-CM

## 2023-04-25 DIAGNOSIS — I7121 Aneurysm of the ascending aorta, without rupture: Secondary | ICD-10-CM

## 2023-04-25 DIAGNOSIS — I10 Essential (primary) hypertension: Secondary | ICD-10-CM | POA: Diagnosis not present

## 2023-04-25 DIAGNOSIS — I2510 Atherosclerotic heart disease of native coronary artery without angina pectoris: Secondary | ICD-10-CM | POA: Diagnosis not present

## 2023-04-25 HISTORY — DX: Aneurysm of the ascending aorta, without rupture: I71.21

## 2023-04-25 HISTORY — DX: Pure hyperglyceridemia: E78.1

## 2023-04-25 HISTORY — DX: Atherosclerotic heart disease of native coronary artery without angina pectoris: I25.10

## 2023-04-25 MED ORDER — IVABRADINE HCL 5 MG PO TABS
ORAL_TABLET | ORAL | 0 refills | Status: DC
  Filled 2023-04-25: qty 2, 1d supply, fill #0

## 2023-04-25 MED ORDER — ICOSAPENT ETHYL 1 G PO CAPS: 2.0000 g | ORAL_CAPSULE | Freq: Two times a day (BID) | ORAL | 3 refills | Status: DC

## 2023-04-25 NOTE — Patient Instructions (Addendum)
Medication Instructions:  TAKE Ivabradine 10 MG (2 5 MG TABLETS)  2 HOURS PRIOR TO CT   START VASCEPA 2 CAPSULES TWICE A DAY   *If you need a refill on your cardiac medications before your next appointment, please call your pharmacy*  Lab Work: LPa/BMET TODAY   FASTING LP/CMET IN 3 MONTHS  If you have labs (blood work) drawn today and your tests are completely normal, you will receive your results only by: MyChart Message (if you have MyChart) OR A paper copy in the mail If you have any lab test that is abnormal or we need to change your treatment, we will call you to review the results.  Testing/Procedures: Your physician has requested that you have cardiac CT. Cardiac computed tomography (CT) is a painless test that uses an x-ray machine to take clear, detailed pictures of your heart. For further information please visit [email or web address] Please follow instruction sheet as given. THE OFFICE WILL CALL YOU TO SCHEDULE ONCE YOUR INSURANCE HAS BEEN REVIEWED   Follow-Up: At [facility], you and your health needs are our priority.  As part of our continuing mission to provide you with exceptional heart care, we have created designated Provider Care Teams.  These Care Teams include your primary Cardiologist (physician) and Advanced Practice Providers (APPs -  Physician Assistants and Nurse Practitioners) who all work together to provide you with the care you need, when you need it.  We recommend signing up for the patient portal called "MyChart".  Sign up information is provided on this After Visit Summary.  MyChart is used to connect with patients for Virtual Visits (Telemedicine).  Patients are able to view lab/test results, encounter notes, upcoming appointments, etc.  Non-urgent messages can be sent to your provider as well.   To learn more about what you can do with MyChart, go to [email or web address].    Your next appointment:   6 month(s)  Provider:   Chilton Si, MD    Other Instructions   Your cardiac CT will be scheduled at one of the below locations:   [facility] [address] [place], [place] [address] [phone]  OR  [facility] [address] [place], [place] [address] [phone]  OR   [facility] [address] [place], [place] [address] [phone]  If scheduled at [facility], please arrive at the [facility] (Entrance C2) of [facility] 30 minutes prior to test start time. You can use the FREE valet parking offered at entrance C (encouraged to control the heart rate for the test)  Proceed to the [facility] Radiology Department (first floor) to check-in and test prep.  All radiology patients and guests should use entrance C2 at [facility], accessed from [facility], even though the hospital's physical address listed is [address].    If scheduled at [facility] or [facility], please arrive 15 mins early for check-in and test prep.   Please follow these instructions carefully (unless otherwise directed):  On the Night Before the Test: Be sure to Drink plenty of water. Do not consume any caffeinated/decaffeinated beverages or chocolate 12 hours prior to your test. Do not take any antihistamines 12 hours prior to your test.  On the Day of the Test: Drink plenty of water until 1 hour prior to the test. Do not eat any food 1 hour prior to  test. You may take your regular medications prior to the test.  Take metoprolol (Lopressor) two hours prior to test. If you take Furosemide/Hydrochlorothiazide/Spironolactone, please HOLD on the morning of the test. FEMALES- please wear underwire-free bra if available, avoid dresses & tight clothing      After the Test: Drink plenty of water. After receiving IV  contrast, you may experience a mild flushed feeling. This is normal. On occasion, you may experience a mild rash up to 24 hours after the test. This is not dangerous. If this occurs, you can take Benadryl 25 mg and increase your fluid intake. If you experience trouble breathing, this can be serious. If it is severe call 911 IMMEDIATELY. If it is mild, please call our office. If you take any of these medications: Glipizide/Metformin, Avandament, Glucavance, please do not take 48 hours after completing test unless otherwise instructed.  We will call to schedule your test 2-4 weeks out understanding that some insurance companies will need an authorization prior to the service being performed.   For non-scheduling related questions, please contact the cardiac imaging nurse navigator should you have any questions/concerns: Rockwell Alexandria, Cardiac Imaging Nurse Navigator Larey Brick, Cardiac Imaging Nurse Navigator [facility] Direct Office Dial: [phone]   For scheduling needs, including cancellations and rescheduling, please call [place], [phone].

## 2023-04-25 NOTE — Progress Notes (Signed)
Cardiology Office Note:    Date:  04/25/2023   ID:  Cynthia [name], DOB Oct 31, 1956, MRN [id number]  PCP:  Assunta Found, MD   [facility] Providers Cardiologist:  None     Referring MD: Assunta Found, MD   CC: Aortic root dilation  History of Present Illness:    Cynthia [name] is a 67 y.o. female with a hx of hypertension, hyperlipidemia, COPD, tobacco abuse, obesity, sciatica, and osteoarthritis, here for the evaluation of dilated aortic root. She was seen by Dr. Phillips Odor on 03/25/2023 for follow-up. Adrenal insufficiency and a smoking history of 52 pack years with COPD was noted. She was still smoking. She had a CT scan of the chest 04/09/2023 revealing extensive coronary artery calcifications, aortic atherosclerosis, and fusiform dilatation of the ascending aorta with maximal dimension 4.1 cm. Emphysema, hepatic steatosis, and small benign pulmonary nodules were also noted. Prior echocardiogram 02/2018 showed LVEF 60-65%, mild LVH, mild mitral regurgitation, and mildly increased systolic pressure of the pulmonary arteries.  Today, she states that she is generally feeling alright, better than she has in months. Her sciatica had been limiting her activity significantly. Now that this has improved she is going on more walks for exercise with some lingering hip pains. She notes occasional episodes of chest pain that may be more related to her abdominal pains. She complains of a severe cough associated with yellowish-green sputum production. This has been ongoing at least since March or April. Next Wednesday she will see a pulmonologist. While in the hospital she had been able to quit smoking for a few days. She sometimes struggles as her medical testing can cause her to feel worked up. Previously she had tried Chantix but this caused hallucinations. Recently she has been able to sleep 5-7 hours. For a time she was only able to sleep for a couple hours during the night. She attributes the  improvement in her sleep quality to starting vitamin D supplementation. Also, she has not been taking fenofibrate due to concerns for side effects of gallbladder damage. She denies any palpitations, peripheral edema, lightheadedness, headaches, syncope, orthopnea, or PND.   Past Medical History:  Diagnosis Date   Ascending aortic aneurysm (HCC) 04/25/2023   CAD in native artery 04/25/2023   Elevated liver enzymes    Elevated triglycerides with high cholesterol    Hypertension    Hypertriglyceridemia 04/25/2023   S/P tooth extraction 01/2013   for dentures   Tobacco use     Past Surgical History:  Procedure Laterality Date   ABDOMINAL HYSTERECTOMY     2 partial    Current Medications: Current Meds  Medication Sig   albuterol (VENTOLIN HFA) 108 (90 Base) MCG/ACT inhaler Inhale 2 puffs into the lungs every 4 (four) hours as needed for wheezing or shortness of breath (cough, shortness of breath or wheezing.).   aspirin EC 81 MG tablet Take 81 mg by mouth daily.   BREZTRI AEROSPHERE 160-9-4.8 MCG/ACT AERO    fenofibrate (TRICOR) 145 MG tablet Take 145 mg by mouth daily.   icosapent Ethyl (VASCEPA) 1 g capsule Take 2 capsules (2 g total) by mouth 2 (two) times daily.   ivabradine (CORLANOR) 5 MG TABS tablet TAKE 2 TABLETS 2 HOURS PRIOR TO CT   olmesartan (BENICAR) 40 MG tablet Take 40 mg by mouth daily.   Vitamin D, Ergocalciferol, (DRISDOL) 1.25 MG (50000 UNIT) CAPS capsule Take 50,000 Units by mouth every 7 (seven) days.     Allergies:   Patient has  no known allergies.   Social History   Socioeconomic History   Marital status: Married    Spouse name: Not on file   Number of children: 0   Years of education: Not on file   Highest education level: Not on file  Occupational History   Not on file  Tobacco Use   Smoking status: Every Day    Packs/day: 2.00    Years: 30.00    Additional pack years: 0.00    Total pack years: 60.00    Types: Cigarettes   Smokeless tobacco:  Never  Vaping Use   Vaping Use: Never used  Substance and Sexual Activity   Alcohol use: No   Drug use: No   Sexual activity: Yes  Other Topics Concern   Not on file  Social History Narrative   Not on file   Social Determinants of Health   Financial Resource Strain: Not on file  Food Insecurity: No Food Insecurity (04/25/2023)   Hunger Vital Sign    Worried About Running Out of Food in the Last Year: Never true    Ran Out of Food in the Last Year: Never true  Transportation Needs: No Transportation Needs (04/25/2023)   PRAPARE - Administrator, Civil Service (Medical): No    Lack of Transportation (Non-Medical): No  Physical Activity: Inactive (04/25/2023)   Exercise Vital Sign    Days of Exercise per Week: 0 days    Minutes of Exercise per Session: 0 min  Stress: Not on file  Social Connections: Not on file     Family History: The patient's family history includes Alzheimer's disease in her mother; Atrial fibrillation in her mother; Diabetes in her mother; Heart attack in her father; Stroke in her maternal grandmother.  ROS:   Please see the history of present illness.    (+) Chest/abdominal pains (+) Hip pain (+) Cough (+) Sputum production All other systems reviewed and are negative.  EKGs/Labs/Other Studies Reviewed:    The following studies were reviewed today:  MRI Abdomen  04/24/2023  ([facility]): IMPRESSION:  1. Diffuse hepatic steatosis.  2.  Clustered cysts at the gallbladder fundus are consistent with gallbladder adenomyomatosis. This corresponds to the finding seen on the prior ultrasound.  3.  Benign left adrenal adenoma.   CT Chest  04/09/2023: IMPRESSION: 1. Small bilateral lower lobe pulmonary nodules are unchanged from remote 2013 exam and considered definitively benign. Per Fleischner Society Guidelines, no routine follow-up imaging is recommended. Recommend assessment for lung cancer screening eligibility, if eligible, adhere to  Lung-RADS guidelines. These guidelines do not apply to immunocompromised patients and patients with cancer. Follow up in patients with significant comorbidities as clinically warranted. For lung cancer screening, adhere to Lung-RADS guidelines. Reference: Radiology. 2017; 284(1):228-43. 2. Extensive coronary artery calcifications. 3. Aortic atherosclerosis. Fusiform dilatation of the ascending aorta, maximal dimension 4.1 cm. Recommend annual imaging followup by CTA or MRA. This recommendation follows 2010 ACCF/AHA/AATS/ACR/ASA/SCA/SCAI/SIR/STS/SVM Guidelines for the Diagnosis and Management of Patients with Thoracic Aortic Disease. Circulation. 2010; 121: Z610-R604. Aortic aneurysm NOS (ICD10-I71.9) 4. Emphysema. 5. Hepatic steatosis.  ABI  04/01/2023: IMPRESSION: Resting ABI of the bilateral lower extremity within normal limits.   Segmental exam at the ankles demonstrates waveforms maintained  LE Venous Doppler  10/05/2021: IMPRESSION: No evidence of bilateral lower extremity deep venous thrombosis.  Echo  02/23/2018: Study Conclusions  - Left ventricle: The cavity size was normal. Wall thickness was    increased in a pattern of mild LVH. Systolic  function was normal.    The estimated ejection fraction was in the range of 60% to 65%.    Wall motion was normal; there were no regional wall motion    abnormalities.  - Aortic valve: Trileaflet; normal thickness leaflets. Valve area    (Vmax): 2.18 cm^2. Valve area (Vmean): 2.44 cm^2.  - Mitral valve: There was mild regurgitation.  - Pulmonary arteries: Systolic pressure was mildly increased. PA    peak pressure: 38 mm Hg (S).   EKG:   EKG is personally reviewed. 04/25/2023: Sinus rhythm. Rate 80 bpm. PACs.  Recent Labs: No results found for requested labs within last 365 days.   Recent Lipid Panel No results found for: "CHOL", "TRIG", "HDL", "CHOLHDL", "VLDL", "LDLCALC", "LDLDIRECT"   Risk Assessment/Calculations:            Physical Exam:    Wt Readings from Last 3 Encounters:  04/25/23 206 lb 4.8 oz (93.6 kg)  03/25/23 215 lb (97.5 kg)  10/04/21 205 lb (93 kg)     VS:  BP 110/70 (BP Location: Right Arm, Patient Position: Sitting, Cuff Size: Large)   Pulse 80   Ht 5\' 5"  (1.651 m)   Wt 206 lb 4.8 oz (93.6 kg)   BMI 34.33 kg/m  , BMI Body mass index is 34.33 kg/m. GENERAL:  Well appearing HEENT: Pupils equal round and reactive, fundi not visualized, oral mucosa unremarkable NECK:  No jugular venous distention, waveform within normal limits, carotid upstroke brisk and symmetric, no bruits, no thyromegaly LUNGS:  Clear to auscultation bilaterally HEART:  RRR.  PMI not displaced or sustained,S1 and S2 within normal limits, no S3, no S4, no clicks, no rubs, no murmurs ABD:  Flat, positive bowel sounds normal in frequency in pitch, no bruits, no rebound, no guarding, no midline pulsatile mass, no hepatomegaly, no splenomegaly EXT:  2 plus pulses throughout, no edema, no cyanosis no clubbing SKIN:  No rashes no nodules NEURO:  Cranial nerves II through XII grossly intact, motor grossly intact throughout PSYCH:  Cognitively intact, oriented to person place and time   ASSESSMENT:    1. Essential hypertension   2. CAD in native artery   3. Hypertriglyceridemia   4. Tobacco abuse   5. Aneurysm of ascending aorta without rupture (HCC)   6. SOB (shortness of breath)   7. Therapeutic drug monitoring    PLAN:    Essential hypertension Blood pressure well-controlled in the office today on olmesartan.  She is encouraged to increase her exercise to least 150 minutes weekly and stop smoking.  CAD in native artery Noted on CT imaging.  She has no symptoms but is not very physically active.  Continue aspirin.  LDL is at goal.  We will get a coronary CT-A to determine the severity of stenosis.  She is hypotensive so no metoprolol.  We will give ivabradine 10mg  prior to the study.    Hypertriglyceridemia Triglycerides are controlled on fenofibrate.  However she is concerned about taking this medication given that she has diabetes.  She also is concerned that it can be associated with gallstones.  Given that she had gallbladder issues she would like to try some different.  Will start Vascepa 1 g twice daily.  Tobacco abuse She is not yet ready to quit but does think she will go cold [place] soon.  She has tried patches in the past that was not helpful.  Ascending aortic aneurysm (HCC) 4.1 cm on CT 03/2023.  She will need an  echo in 6 months.   Disposition: FU with Cynthia Belgrave C. Duke Salvia, MD, [facility] in 6 months.  Medication Adjustments/Labs and Tests Ordered: Current medicines are reviewed at length with the patient today.  Concerns regarding medicines are outlined above.   Orders Placed This Encounter  Procedures   CT CORONARY MORPH W/CTA COR W/SCORE W/CA W/CM &/OR WO/CM   Lipoprotein A (LPA)   Basic metabolic panel   Lipid panel   Comprehensive metabolic panel   EKG 12-Lead   Meds ordered this encounter  Medications   ivabradine (CORLANOR) 5 MG TABS tablet    Sig: TAKE 2 TABLETS 2 HOURS PRIOR TO CT    Dispense:  2 tablet    Refill:  0   icosapent Ethyl (VASCEPA) 1 g capsule    Sig: Take 2 capsules (2 g total) by mouth 2 (two) times daily.    Dispense:  360 capsule    Refill:  3   Patient Instructions  Medication Instructions:  TAKE Ivabradine 10 MG (2 5 MG TABLETS)  2 HOURS PRIOR TO CT   START VASCEPA 2 CAPSULES TWICE A DAY   *If you need a refill on your cardiac medications before your next appointment, please call your pharmacy*  Lab Work: LPa/BMET TODAY   FASTING LP/CMET IN 3 MONTHS  If you have labs (blood work) drawn today and your tests are completely normal, you will receive your results only by: MyChart Message (if you have MyChart) OR A paper copy in the mail If you have any lab test that is abnormal or we need to change your treatment,  we will call you to review the results.  Testing/Procedures: Your physician has requested that you have cardiac CT. Cardiac computed tomography (CT) is a painless test that uses an x-ray machine to take clear, detailed pictures of your heart. For further information please visit [email or web address] Please follow instruction sheet as given. THE OFFICE WILL CALL YOU TO SCHEDULE ONCE YOUR INSURANCE HAS BEEN REVIEWED   Follow-Up: At [facility], you and your health needs are our priority.  As part of our continuing mission to provide you with exceptional heart care, we have created designated Provider Care Teams.  These Care Teams include your primary Cardiologist (physician) and Advanced Practice Providers (APPs -  Physician Assistants and Nurse Practitioners) who all work together to provide you with the care you need, when you need it.  We recommend signing up for the patient portal called "MyChart".  Sign up information is provided on this After Visit Summary.  MyChart is used to connect with patients for Virtual Visits (Telemedicine).  Patients are able to view lab/test results, encounter notes, upcoming appointments, etc.  Non-urgent messages can be sent to your provider as well.   To learn more about what you can do with MyChart, go to [email or web address].    Your next appointment:   6 month(s)  Provider:   Chilton Si, MD    Other Instructions   Your cardiac CT will be scheduled at one of the below locations:   [facility] [address] [place], [place] [address] [phone]  OR  [facility] [address] [place], [place] [address] [phone]  OR   [facility] [address] [place], [place] [address] [phone]  If scheduled at [facility], please arrive at the [facility] Entrance (Entrance C2) of [facility] 30 minutes prior to test  start time.  You can use the FREE valet parking offered at entrance C (encouraged to control the heart rate for the test)  Proceed to the [facility] Radiology Department (first floor) to check-in and test prep.  All radiology patients and guests should use entrance C2 at [facility], accessed from [facility], even though the hospital's physical address listed is [address]    If scheduled at [facility] or [facility], please arrive 15 mins early for check-in and test prep.   Please follow these instructions carefully (unless otherwise directed):  On the Night Before the Test: Be sure to Drink plenty of water. Do not consume any caffeinated/decaffeinated beverages or chocolate 12 hours prior to your test. Do not take any antihistamines 12 hours prior to your test.  On the Day of the Test: Drink plenty of water until 1 hour prior to the test. Do not eat any food 1 hour prior to test. You may take your regular medications prior to the test.  Take metoprolol (Lopressor) two hours prior to test. If you take Furosemide/Hydrochlorothiazide/Spironolactone, please HOLD on the morning of the test. FEMALES- please wear underwire-free bra if available, avoid dresses & tight clothing      After the Test: Drink plenty of water. After receiving IV contrast, you may experience a mild flushed feeling. This is normal. On occasion, you may experience a mild rash up to 24 hours after the test. This is not dangerous. If this occurs, you can take Benadryl 25 mg and increase your fluid intake. If you experience trouble breathing, this can be serious. If it is severe call 911 IMMEDIATELY. If it is mild, please call our office. If you take any of these medications: Glipizide/Metformin, Avandament, Glucavance, please do not take 48 hours after completing test unless otherwise instructed.  We will call to schedule your test 2-4  weeks out understanding that some insurance companies will need an authorization prior to the service being performed.   For non-scheduling related questions, please contact the cardiac imaging nurse navigator should you have any questions/concerns: Rockwell Alexandria, Cardiac Imaging Nurse Navigator Larey Brick, Cardiac Imaging Nurse Navigator [facility] Direct Office Dial: [phone]   For scheduling needs, including cancellations and rescheduling, please call [place], [phone].     I,Mathew Stumpf,acting as a Neurosurgeon for Chilton Si, MD.,have documented all relevant documentation on the behalf of Chilton Si, MD,as directed by  Chilton Si, MD while in the presence of Chilton Si, MD.  I, Tyrelle Raczka C. Duke Salvia, MD have reviewed all documentation for this visit.  The documentation of the exam, diagnosis, procedures, and orders on 04/25/2023 are all accurate and complete.   Signed, Chilton Si, MD  04/25/2023 1:16 PM    [facility]

## 2023-04-25 NOTE — Assessment & Plan Note (Signed)
Triglycerides are controlled on fenofibrate.  However she is concerned about taking this medication given that she has diabetes.  She also is concerned that it can be associated with gallstones.  Given that she had gallbladder issues she would like to try some different.  Will start Vascepa 1 g twice daily.

## 2023-04-25 NOTE — Assessment & Plan Note (Signed)
4.1 cm on CT 03/2023.  She will need an echo in 6 months.

## 2023-04-25 NOTE — Assessment & Plan Note (Signed)
Blood pressure well-controlled in the office today on olmesartan.  She is encouraged to increase her exercise to least 150 minutes weekly and stop smoking.

## 2023-04-25 NOTE — Assessment & Plan Note (Signed)
She is not yet ready to quit but does think she will go cold [place] soon.  She has tried patches in the past that was not helpful.

## 2023-04-25 NOTE — Assessment & Plan Note (Addendum)
Noted on CT imaging.  She has no symptoms but is not very physically active.  Continue aspirin.  LDL is at goal.  We will get a coronary CT-A to determine the severity of stenosis.  She is hypotensive so no metoprolol.  We will give ivabradine 10mg  prior to the study.

## 2023-04-26 ENCOUNTER — Other Ambulatory Visit (HOSPITAL_COMMUNITY): Payer: Self-pay

## 2023-04-29 DIAGNOSIS — I2510 Atherosclerotic heart disease of native coronary artery without angina pectoris: Secondary | ICD-10-CM | POA: Diagnosis not present

## 2023-04-29 DIAGNOSIS — R0602 Shortness of breath: Secondary | ICD-10-CM | POA: Diagnosis not present

## 2023-04-30 LAB — BASIC METABOLIC PANEL
BUN/Creatinine Ratio: 16 (ref 12–28)
BUN: 14 mg/dL (ref 8–27)
CO2: 26 mmol/L (ref 20–29)
Calcium: 9.7 mg/dL (ref 8.7–10.3)
Chloride: 96 mmol/L (ref 96–106)
Creatinine, Ser: 0.89 mg/dL (ref 0.57–1.00)
Glucose: 121 mg/dL — ABNORMAL HIGH (ref 70–99)
Potassium: 5.3 mmol/L — ABNORMAL HIGH (ref 3.5–5.2)
Sodium: 134 mmol/L (ref 134–144)
eGFR: 71 mL/min/{1.73_m2} (ref >59)

## 2023-04-30 LAB — LIPOPROTEIN A (LPA): Lipoprotein (a): <8.4 nmol/L (ref <75.0)

## 2023-04-30 NOTE — Progress Notes (Unsigned)
Cynthia [name], female    DOB: Mar 13, 1956    MRN: [id number]   Brief patient profile:  64  yowf  active smoker  referred to pulmonary clinic in [place]  05/02/2023 by Jeannetta Ellis  for recurrent pna x 2019 and new cough x  March 2024      History of Present Illness  05/02/2023  Pulmonary/ 1st office eval/ Sherene Sires / [facility]  on [place]  Chief Complaint  Patient presents with   Consult    Pt consult referred by PCP for COPD evaluation, she currently has a productive cuogh w/ yellow sputum. Smokes 1-2ppd and is trying to quit. She reports that her O2 sats had been low but have since improved  Dyspnea:  limited by R hip pain / walks half mile around [place] x 30 min plus stationery bike has to stop at of hills  Cough: first thing in am yellow/ thick no recent abx  x 30 min  Sleep: bed is flat/ lots of pillows  SABA use: not using hfa much/ uses neb more  02: none  Gen bilateral lower chest and abd pain just when coughing   No obvious day to day or daytime pattern/variability or assoc   mucus plugs or hemoptysis or cp or chest tightness, subjective wheeze or overt   hb symptoms.    . Also denies any obvious fluctuation of symptoms with weather or environmental changes or other aggravating or alleviating factors except as outlined above   No unusual exposure hx or h/o childhood pna/ asthma or knowledge of premature birth.  Current Allergies, Complete Past Medical History, Past Surgical History, Family History, and Social History were reviewed in [employer] record.  ROS  The following are not active complaints unless bolded Hoarseness, sore throat, dysphagia, dental problems, itching, sneezing,  nasal congestion or discharge of excess mucus or purulent secretions, ear ache,   fever, chills, sweats, unintended wt loss or wt gain, classically pleuritic or exertional cp,  orthopnea pnd or arm/hand swelling  or leg swelling ankles only presyncope,  palpitations, abdominal pain just with coughing fits, anorexia, nausea, vomiting, diarrhea  or change in bowel habits or change in bladder habits, change in stools or change in urine, dysuria, hematuria,  rash, arthralgias, visual complaints, headache, numbness, weakness or ataxia or problems with walking or coordination,  change in mood or  memory.             Past Medical History:  Diagnosis Date   Ascending aortic aneurysm (HCC) 04/25/2023   CAD in native artery 04/25/2023   Elevated liver enzymes    Elevated triglycerides with high cholesterol    Hypertension    Hypertriglyceridemia 04/25/2023   S/P tooth extraction 01/2013   for dentures   Tobacco use     Outpatient Medications Prior to Visit  Medication Sig Dispense Refill   albuterol (VENTOLIN HFA) 108 (90 Base) MCG/ACT inhaler Inhale 2 puffs into the lungs every 4 (four) hours as needed for wheezing or shortness of breath (cough, shortness of breath or wheezing.). 1 each 1   aspirin EC 81 MG tablet Take 81 mg by mouth daily.     BREZTRI AEROSPHERE 160-9-4.8 MCG/ACT AERO      icosapent Ethyl (VASCEPA) 1 g capsule Take 2 capsules (2 g total) by mouth 2 (two) times daily. 360 capsule 3   ivabradine (CORLANOR) 5 MG TABS tablet TAKE 2 TABLETS 2 HOURS PRIOR TO CT 2 tablet 0   olmesartan (BENICAR)  40 MG tablet Take 40 mg by mouth daily.     Vitamin D, Ergocalciferol, (DRISDOL) 1.25 MG (50000 UNIT) CAPS capsule Take 50,000 Units by mouth every 7 (seven) days.     fenofibrate (TRICOR) 145 MG tablet Take 145 mg by mouth daily.     No facility-administered medications prior to visit.     Objective:     BP 130/67   Pulse 96   Ht 5\' 5"  (1.651 m)   Wt 202 lb 9.6 oz (91.9 kg)   SpO2 96% Comment: RA  BMI 33.71 kg/m   SpO2: 96 % (RA)  Pleasant mod obese(by BMI) amb wf / congested cough   HEENT : Oropharynx  clear/ top dentures   Nasal turbinates mod non-specific edema    NECK :  without  apparent JVD/ palpable Nodes/TM     LUNGS: no acc muscle use,  Min barrel  contour chest wall with bilateral  slightly decreased bs min exp rhonchi on FVC and  without cough on insp or exp maneuvers and min  Hyperresonant  to  percussion bilaterally    CV:  RRR  no s3 or murmur or increase in P2, and no edema   ABD:  obese soft and nontender with pos end  insp Hoover's  in the supine position.  No bruits or organomegaly appreciated   MS:  Nl gait/ ext warm without deformities Or obvious joint restrictions  calf tenderness, cyanosis or clubbing     SKIN: warm and dry without lesions    NEURO:  alert, approp, nl sensorium with  no motor or cerebellar deficits apparent.          I personally reviewed images and agree with radiology impression as follows:   Chest CT 04/09/23  s contrast   Small bilateral lower lobe pulmonary nodules are unchanged from remote 2013 exam. Emphysema    Assessment   Asthmatic bronchitis Active smoker - emphysema on CT 04/09/23  - Labs ordered 05/02/2023  :  allergy profile   alpha one AT phenotype   - 05/02/2023  After extensive coaching inhaler device,  effectiveness =    60% (short ti) > continue breztri/ rx rhinitis/ sinusitis with augmentin and empirical gerd x 6 weeks then regroup  DDX of  difficult airways management almost all start with A and  include Adherence, Ace Inhibitors, Acid Reflux, Active Sinus Disease, Alpha 1 Antitripsin deficiency, Anxiety masquerading as Airways dz,  ABPA,  Allergy(esp in young), Aspiration (esp in elderly), Adverse effects of meds,  Active smoking or vaping, A bunch of PE's (a small clot burden can't cause this syndrome unless there is already severe underlying pulm or vascular dz with poor reserve) plus two Bs  = Bronchiectasis and Beta blocker use..and one C= CHF   Adherence is always the initial "prime suspect" and is a multilayered concern that requires a "trust but verify" approach in every patient - starting with knowing how to use medications,  especially inhalers, correctly, keeping up with refills and understanding the fundamental difference between maintenance and prns vs those medications only taken for a very short course and then stopped and not refilled.  - see hfa teaching - return with all meds in hand using a trust but verify approach to confirm accurate Medication  Reconciliation The principal here is that until we are certain that the  patients are doing what we've asked, it makes no sense to ask them to do more.   Active smoking greatest concern  (see  separate a/p)   ? Active sinusitis > recurrent bronchitis > augmentin x 10 d, consider sinus CT later  ? Allergy > check eos/ continue high dose ICS for now  ? Alpha one AT def > check phenotype  ? Adverse drug effects > none of the usual suspects listed   ? Chf/ cardiac asthma > cards w/u in progress         Each maintenance medication was reviewed in detail including emphasizing most importantly the difference between maintenance and prns and under what circumstances the prns are to be triggered using an action plan format where appropriate.  Total time for H and P, chart review, counseling, reviewing when to use which device(s) and generating customized AVS unique to this office visit / same day charting = 45 min with new pt eval        Cigarette smoker 4-5 min discussion re active cigarette smoking in addition to office E&M  Ask about tobacco use:   ongoing Advise quitting   I took an extended  opportunity with this patient to outline the consequences of continued cigarette use  in airway disorders based on all the data we have from the multiple national lung health studies (perfomed over decades at millions of dollars in cost)  indicating that smoking cessation, not choice of inhalers or pulmonary physicians, is the most important aspect of her care.   Assess willingness:  Not committed at this point Assist in quit attempt:  Per PCP when ready Arrange follow  up:   Follow up per Primary Care planned           Sandrea Hughs, MD 05/02/2023

## 2023-05-02 ENCOUNTER — Ambulatory Visit (INDEPENDENT_AMBULATORY_CARE_PROVIDER_SITE_OTHER): Payer: Medicare Other | Admitting: Internal Medicine

## 2023-05-02 ENCOUNTER — Encounter: Payer: Self-pay | Admitting: Internal Medicine

## 2023-05-02 VITALS — BP 130/67 | HR 96 | Ht 65.0 in | Wt 202.6 lb

## 2023-05-02 DIAGNOSIS — F17210 Nicotine dependence, cigarettes, uncomplicated: Secondary | ICD-10-CM | POA: Diagnosis not present

## 2023-05-02 DIAGNOSIS — J4540 Moderate persistent asthma, uncomplicated: Secondary | ICD-10-CM

## 2023-05-02 MED ORDER — BREZTRI AEROSPHERE 160-9-4.8 MCG/ACT IN AERO
2.0000 | INHALATION_SPRAY | Freq: Two times a day (BID) | RESPIRATORY_TRACT | 0 refills | Status: DC
Start: 2023-05-02 — End: 2023-06-19

## 2023-05-02 MED ORDER — PANTOPRAZOLE SODIUM 40 MG PO TBEC: 40.0000 mg | DELAYED_RELEASE_TABLET | Freq: Every day | ORAL | 2 refills | Status: DC

## 2023-05-02 MED ORDER — ALBUTEROL SULFATE HFA 108 (90 BASE) MCG/ACT IN AERS: INHALATION_SPRAY | RESPIRATORY_TRACT | 11 refills | Status: AC

## 2023-05-02 MED ORDER — AMOXICILLIN-POT CLAVULANATE 875-125 MG PO TABS
1.0000 | ORAL_TABLET | Freq: Two times a day (BID) | ORAL | 0 refills | Status: DC
Start: 2023-05-02 — End: 2023-06-19

## 2023-05-02 MED ORDER — FAMOTIDINE 20 MG PO TABS: ORAL_TABLET | ORAL | 11 refills | Status: DC

## 2023-05-02 NOTE — Patient Instructions (Addendum)
The key is to stop smoking completely before smoking completely stops you!   Augmentin 875 mg take one pill twice daily  X 10 days - take at breakfast and supper with large glass of water.  It would help reduce the usual side effects (diarrhea and yeast infections) if you ate cultured yogurt at lunch.   Cough  >  Mucinex dm 1200 mg twice daily as needed   Plan A = Automatic = Always=    Breztri Take 2 puffs first thing in am and then another 2 puffs about 12 hours later.    Work on inhaler technique:  relax and gently blow all the way out then take a nice smooth full deep breath back in, triggering the inhaler at same time you start breathing in.  Hold breath in for at least  5 seconds if you can. Blow out [employer] out  thru nose. Rinse and gargle with water when done.  If mouth or throat bother you at all,  try brushing teeth/gums/tongue with arm and hammer toothpaste/ make a slurry and gargle and spit out.   >>>  Remember how golfers warm up by taking practice swings - do this with an empty inhaler      Plan B = Backup (to supplement plan A, not to replace it) Only use your albuterol inhaler as a rescue medication to be used if you can't catch your breath by resting or doing a relaxed purse lip breathing pattern.  - The less you use it, the better it will work when you need it. - Ok to use the inhaler up to 2 puffs  every 4 hours if you must but call for appointment if use goes up over your usual need - Don't leave home without it !!  (think of it like the spare tire for your car)   Plan C = Crisis (instead of Plan B but only if Plan B stops working) - only use your albuterol nebulizer if you first try Plan B and it fails to help > ok to use the nebulizer up to every 4 hours but if start needing it regularly call for immediate appointment     Pantoprazole (protonix) 40 mg   Take  30-60 min before first meal of the day and Pepcid (famotidine)  20 mg after supper until return to office - this  is the best way to tell whether stomach acid is contributing to your problem.  GERD (REFLUX)  is an extremely common cause of respiratory symptoms just like yours , many times with no obvious heartburn at all.    It can be treated with medication, but also with lifestyle changes including elevation of the head of your bed (ideally with 6 -8inch blocks under the headboard of your bed),  Smoking cessation, avoidance of late meals, excessive alcohol, and avoid fatty foods, chocolate, peppermint, colas, red wine, and acidic juices such as orange juice.  NO MINT OR MENTHOL PRODUCTS SO NO COUGH DROPS  USE SUGARLESS CANDY INSTEAD (Jolley ranchers or Stover's or Life Savers) or even ice chips will also do - the key is to swallow to prevent all throat clearing. NO OIL BASED VITAMINS - use powdered substitutes.  Avoid fish oil when coughing.   My office will be contacting you by phone for referral for PFT 1st available [facility] is fine - if you don't hear back from my office within one week please call us back or notify us thru MyChart and we'll address it  right away.     The key is to stop smoking completely before smoking completely stops you!    Please remember to go to the lab department   for your tests - we will call you with the results when they are available.      Please schedule a follow up office visit in 6 weeks, call sooner if needed

## 2023-05-02 NOTE — Assessment & Plan Note (Signed)
4-5 min discussion re active cigarette smoking in addition to office E&M  Ask about tobacco use:   ongoing Advise quitting   I took an extended  opportunity with this patient to outline the consequences of continued cigarette use  in airway disorders based on all the data we have from the multiple national lung health studies (perfomed over decades at millions of dollars in cost)  indicating that smoking cessation, not choice of inhalers or pulmonary  physicians, is the most important aspect of her  care.   Assess willingness:  Not committed at this point Assist in quit attempt:  Per PCP when ready Arrange follow up:   Follow up per Primary Care planned      

## 2023-05-02 NOTE — Assessment & Plan Note (Addendum)
Active smoker - emphysema on CT 04/09/23  - Labs ordered 05/02/2023  :  allergy profile   alpha one AT phenotype   - 05/02/2023  After extensive coaching inhaler device,  effectiveness =    60% (short ti) > continue breztri/ rx rhinitis/ sinusitis with augmentin and empirical gerd x 6 weeks then regroup  DDX of  difficult airways management almost all start with A and  include Adherence, Ace Inhibitors, Acid Reflux, Active Sinus Disease, Alpha 1 Antitripsin deficiency, Anxiety masquerading as Airways dz,  ABPA,  Allergy(esp in young), Aspiration (esp in elderly), Adverse effects of meds,  Active smoking or vaping, A bunch of PE's (a small clot burden can't cause this syndrome unless there is already severe underlying pulm or vascular dz with poor reserve) plus two Bs  = Bronchiectasis and Beta blocker use..and one C= CHF   Adherence is always the initial "prime suspect" and is a multilayered concern that requires a "trust but verify" approach in every patient - starting with knowing how to use medications, especially inhalers, correctly, keeping up with refills and understanding the fundamental difference between maintenance and prns vs those medications only taken for a very short course and then stopped and not refilled.  - see hfa teaching - return with all meds in hand using a trust but verify approach to confirm accurate Medication  Reconciliation The principal here is that until we are certain that the  patients are doing what we've asked, it makes no sense to ask them to do more.   Active smoking greatest concern  (see separate a/p)   ? Active sinusitis > recurrent bronchitis > augmentin x 10 d, consider sinus CT later  ? Allergy > check eos/ continue high dose ICS for now  ? Alpha one AT def > check phenotype  ? Adverse drug effects > none of the usual suspects listed   ? Chf/ cardiac asthma > cards w/u in progress         Each maintenance medication was reviewed in detail including  emphasizing most importantly the difference between maintenance and prns and under what circumstances the prns are to be triggered using an action plan format where appropriate.  Total time for H and P, chart review, counseling, reviewing when to use which device(s) and generating customized AVS unique to this office visit / same day charting = 45 min with new pt eval

## 2023-05-03 ENCOUNTER — Ambulatory Visit (HOSPITAL_COMMUNITY)
Admission: RE | Admit: 2023-05-03 | Discharge: 2023-05-03 | Disposition: A | Discharge status: Home / Self Care | Payer: Medicare Other | Source: Ambulatory Visit | Attending: Family Medicine | Admitting: Family Medicine

## 2023-05-03 ENCOUNTER — Institutional Professional Consult (permissible substitution): Payer: Federal, State, Local not specified - PPO | Admitting: Internal Medicine

## 2023-05-03 ENCOUNTER — Encounter (HOSPITAL_COMMUNITY): Payer: Self-pay

## 2023-05-03 DIAGNOSIS — Z1231 Encounter for screening mammogram for malignant neoplasm of breast: Secondary | ICD-10-CM | POA: Insufficient documentation

## 2023-05-07 ENCOUNTER — Telehealth (HOSPITAL_COMMUNITY): Payer: Self-pay | Admitting: Emergency Medicine

## 2023-05-07 NOTE — Telephone Encounter (Signed)
Reaching out to patient to offer assistance regarding upcoming cardiac imaging study; pt verbalizes understanding of appt date/time, parking situation and where to check in, pre-test NPO status and medications ordered, and verified current allergies; name and call back number provided for further questions should they arise Tylik Treese RN Navigator Cardiac Imaging [facility] [phone] office [phone] cell 

## 2023-05-08 ENCOUNTER — Other Ambulatory Visit: Payer: Self-pay | Admitting: Cardiology

## 2023-05-08 ENCOUNTER — Ambulatory Visit (HOSPITAL_COMMUNITY)
Admission: RE | Admit: 2023-05-08 | Discharge: 2023-05-08 | Disposition: A | Discharge status: Home / Self Care | Payer: Medicare Other | Source: Ambulatory Visit | Attending: Cardiovascular Disease | Admitting: Cardiovascular Disease

## 2023-05-08 ENCOUNTER — Ambulatory Visit (HOSPITAL_BASED_OUTPATIENT_CLINIC_OR_DEPARTMENT_OTHER)
Admission: RE | Admit: 2023-05-08 | Discharge: 2023-05-08 | Disposition: A | Discharge status: Home / Self Care | Payer: Medicare Other | Source: Ambulatory Visit | Attending: Cardiology | Admitting: Cardiology

## 2023-05-08 DIAGNOSIS — I10 Essential (primary) hypertension: Secondary | ICD-10-CM | POA: Diagnosis not present

## 2023-05-08 DIAGNOSIS — R0602 Shortness of breath: Secondary | ICD-10-CM | POA: Diagnosis not present

## 2023-05-08 DIAGNOSIS — E781 Pure hyperglyceridemia: Secondary | ICD-10-CM | POA: Diagnosis not present

## 2023-05-08 DIAGNOSIS — I2510 Atherosclerotic heart disease of native coronary artery without angina pectoris: Secondary | ICD-10-CM

## 2023-05-08 DIAGNOSIS — R931 Abnormal findings on diagnostic imaging of heart and coronary circulation: Secondary | ICD-10-CM | POA: Insufficient documentation

## 2023-05-08 LAB — CBC WITH DIFFERENTIAL/PLATELET
Basophils Absolute: 0.1 10*3/uL (ref 0.0–0.2)
Basos: 1 %
EOS (ABSOLUTE): 0.1 10*3/uL (ref 0.0–0.4)
Eos: 1 %
Hematocrit: 46.5 % (ref 34.0–46.6)
Hemoglobin: 15.7 g/dL (ref 11.1–15.9)
Immature Grans (Abs): 0.1 10*3/uL (ref 0.0–0.1)
Immature Granulocytes: 1 %
Lymphocytes Absolute: 1.7 10*3/uL (ref 0.7–3.1)
Lymphs: 18 %
MCH: 31.3 pg (ref 26.6–33.0)
MCHC: 33.8 g/dL (ref 31.5–35.7)
MCV: 93 fL (ref 79–97)
Monocytes Absolute: 0.7 10*3/uL (ref 0.1–0.9)
Monocytes: 7 %
Neutrophils Absolute: 6.9 10*3/uL (ref 1.4–7.0)
Neutrophils: 72 %
Platelets: 215 10*3/uL (ref 150–450)
RBC: 5.02 x10E6/uL (ref 3.77–5.28)
RDW: 12.9 % (ref 11.7–15.4)
WBC: 9.6 10*3/uL (ref 3.4–10.8)

## 2023-05-08 LAB — ALPHA-1-ANTITRYPSIN PHENOTYP: A-1 Antitrypsin: 159 mg/dL (ref 101–187)

## 2023-05-08 MED ORDER — IOHEXOL 350 MG/ML SOLN
95.0000 mL | Freq: Once | INTRAVENOUS | Status: AC | PRN
  Administered 2023-05-08: 95 mL via INTRAVENOUS

## 2023-05-08 MED ORDER — NITROGLYCERIN 0.4 MG SL SUBL: 0.8000 mg | SUBLINGUAL_TABLET | Freq: Once | SUBLINGUAL | Status: DC

## 2023-05-08 MED ORDER — NITROGLYCERIN 0.4 MG SL SUBL
SUBLINGUAL_TABLET | SUBLINGUAL | Status: AC
  Filled 2023-05-08: qty 2

## 2023-05-10 ENCOUNTER — Encounter (HOSPITAL_BASED_OUTPATIENT_CLINIC_OR_DEPARTMENT_OTHER): Payer: Self-pay | Admitting: *Deleted

## 2023-05-10 DIAGNOSIS — E785 Hyperlipidemia, unspecified: Secondary | ICD-10-CM

## 2023-05-10 DIAGNOSIS — I10 Essential (primary) hypertension: Secondary | ICD-10-CM

## 2023-05-10 DIAGNOSIS — E876 Hypokalemia: Secondary | ICD-10-CM

## 2023-05-10 MED ORDER — ROSUVASTATIN CALCIUM 10 MG PO TABS: 10.0000 mg | ORAL_TABLET | Freq: Every day | ORAL | 3 refills | Status: DC

## 2023-05-15 ENCOUNTER — Other Ambulatory Visit: Payer: Self-pay

## 2023-05-15 ENCOUNTER — Encounter: Payer: Self-pay | Admitting: Internal Medicine

## 2023-05-15 DIAGNOSIS — J4540 Moderate persistent asthma, uncomplicated: Secondary | ICD-10-CM

## 2023-05-23 DIAGNOSIS — E876 Hypokalemia: Secondary | ICD-10-CM | POA: Diagnosis not present

## 2023-05-23 DIAGNOSIS — I10 Essential (primary) hypertension: Secondary | ICD-10-CM | POA: Diagnosis not present

## 2023-05-23 LAB — BASIC METABOLIC PANEL
BUN/Creatinine Ratio: 13 (ref 12–28)
BUN: 11 mg/dL (ref 8–27)
CO2: 25 mmol/L (ref 20–29)
Calcium: 9.3 mg/dL (ref 8.7–10.3)
Chloride: 92 mmol/L — ABNORMAL LOW (ref 96–106)
Creatinine, Ser: 0.83 mg/dL (ref 0.57–1.00)
Glucose: 91 mg/dL (ref 70–99)
Potassium: 4.7 mmol/L (ref 3.5–5.2)
Sodium: 133 mmol/L — ABNORMAL LOW (ref 134–144)
eGFR: 78 mL/min/{1.73_m2} (ref >59)

## 2023-05-31 ENCOUNTER — Ambulatory Visit (HOSPITAL_COMMUNITY)
Admission: RE | Admit: 2023-05-31 | Discharge: 2023-05-31 | Disposition: A | Discharge status: Home / Self Care | Payer: Medicare Other | Source: Ambulatory Visit | Attending: Internal Medicine | Admitting: Internal Medicine

## 2023-05-31 DIAGNOSIS — J4540 Moderate persistent asthma, uncomplicated: Secondary | ICD-10-CM | POA: Diagnosis not present

## 2023-05-31 LAB — PULMONARY FUNCTION TEST
DL/VA % pred: 110 %
DL/VA: 4.59 ml/min/mmHg/L
DLCO unc % pred: 66 %
DLCO unc: 13.37 ml/min/mmHg
FEF 25-75 Post: 0.63 L/sec
FEF 25-75 Pre: 0.68 L/sec
FEF2575-%Change-Post: -7 %
FEF2575-%Pred-Post: 29 %
FEF2575-%Pred-Pre: 31 %
FEV1-%Change-Post: 0 %
FEV1-%Pred-Post: 54 %
FEV1-%Pred-Pre: 54 %
FEV1-Post: 1.34 L
FEV1-Pre: 1.35 L
FEV1FVC-%Change-Post: 1 %
FEV1FVC-%Pred-Pre: 85 %
FEV6-%Change-Post: -2 %
FEV6-%Pred-Post: 62 %
FEV6-%Pred-Pre: 64 %
FEV6-Post: 1.96 L
FEV6-Pre: 2.02 L
FEV6FVC-%Change-Post: 0 %
FEV6FVC-%Pred-Post: 101 %
FEV6FVC-%Pred-Pre: 102 %
FVC-%Change-Post: -2 %
FVC-%Pred-Post: 61 %
FVC-%Pred-Pre: 63 %
FVC-Post: 2.01 L
FVC-Pre: 2.06 L
Post FEV1/FVC ratio: 67 %
Post FEV6/FVC ratio: 98 %
Pre FEV1/FVC ratio: 66 %
Pre FEV6/FVC Ratio: 98 %
RV % pred: 99 %
RV: 2.14 L
TLC % pred: 79 %
TLC: 4.16 L

## 2023-05-31 MED ORDER — ALBUTEROL SULFATE (2.5 MG/3ML) 0.083% IN NEBU
2.5000 mg | INHALATION_SOLUTION | Freq: Once | RESPIRATORY_TRACT | Status: AC
  Administered 2023-05-31: 2.5 mg via RESPIRATORY_TRACT

## 2023-06-02 ENCOUNTER — Encounter: Payer: Self-pay | Admitting: Internal Medicine

## 2023-06-19 ENCOUNTER — Encounter: Payer: Self-pay | Admitting: Internal Medicine

## 2023-06-19 ENCOUNTER — Ambulatory Visit (INDEPENDENT_AMBULATORY_CARE_PROVIDER_SITE_OTHER): Payer: Medicare Other | Admitting: Internal Medicine

## 2023-06-19 VITALS — BP 124/66 | HR 69 | Ht 65.0 in | Wt 199.8 lb

## 2023-06-19 DIAGNOSIS — G8929 Other chronic pain: Secondary | ICD-10-CM

## 2023-06-19 DIAGNOSIS — F17210 Nicotine dependence, cigarettes, uncomplicated: Secondary | ICD-10-CM

## 2023-06-19 DIAGNOSIS — M25551 Pain in right hip: Secondary | ICD-10-CM | POA: Diagnosis not present

## 2023-06-19 DIAGNOSIS — J449 Chronic obstructive pulmonary disease, unspecified: Secondary | ICD-10-CM

## 2023-06-19 DIAGNOSIS — J329 Chronic sinusitis, unspecified: Secondary | ICD-10-CM

## 2023-06-19 NOTE — Progress Notes (Signed)
Cynthia [name], female    DOB: August 09, 1956    MRN: [id number]   Brief patient profile:  34  yowf  active smoker  referred to pulmonary clinic in [place]  05/02/2023 by Jeannetta Ellis  for recurrent pna x 2019 and new cough x  March 2024      History of Present Illness  05/02/2023  Pulmonary/ 1st office eval/ Sherene Sires / [facility]  on [place]  Chief Complaint  Patient presents with   Consult    Pt consult referred by PCP for COPD evaluation, she currently has a productive cuogh w/ yellow sputum. Smokes 1-2ppd and is trying to quit. She reports that her O2 sats had been low but have since improved  Dyspnea:  limited by R hip pain / walks half mile around [place] x 30 min plus stationery bike has to stop at of hills  Cough: first thing in am yellow/ thick no recent abx  x 30 min  Sleep: bed is flat/ lots of pillows  SABA use: not using hfa much/ uses neb more  02: none  Gen bilateral lower chest and abd pain just when coughing  Rec The key is to stop smoking completely before smoking completely stops you!  Augmentin 875 mg take one pill twice daily  X 10 days  Cough  >  Mucinex dm 1200 mg twice daily as needed  Plan A = Automatic = Always=    Breztri Take 2 puffs first thing in am and then another 2 puffs about 12 hours later.  Work on inhaler technique:   Plan B = Backup (to supplement plan A, not to replace it) Only use your albuterol inhaler as a rescue medication Plan C = Crisis (instead of Plan B but only if Plan B stops working) - only use your albuterol nebulizer if you first try Plan B and it fails to help > ok to use the nebulizer up to every 4 hours but if start needing it regularly call for immediate appointment Pantoprazole (protonix) 40 mg   Take  30-60 min before first meal of the day and Pepcid (famotidine)  20 mg after supper until return to office GERD diet reviewed, bed blocks rec     Please remember to go to the lab department  allergy screen Eos 0.1   alpha  one AT phenotype  MM  level 159   Please schedule a follow up office visit in 6 weeks, call sooner if needed    06/19/2023  f/u ov/[facility]/Pernell Dikes re: GOLD 2/ AB maint on breztri   Chief Complaint  Patient presents with   Follow-up    Cough is some better- still occ yellow sputum. Breathing is overall doing well and has not needed her albuterol.   Dyspnea:  limited by R hip and no better with injections/ some better with nsaids  Cough: mostly in am x 1 hour, yellow Sleeping: flat bed / bunch of pillows SABA use: not using either  02: none    Lung cancer screening: 03/3023 reviewed   No obvious day to day or daytime variability or assoc excess/ purulent sputum or mucus plugs or hemoptysis or cp or chest tightness, subjective wheeze or overt sinus or hb symptoms.   Sleeing as above  without nocturnal  or early am exacerbation  of respiratory  c/o's or need for noct saba. Also denies any obvious fluctuation of symptoms with weather or environmental changes or other aggravating or alleviating factors except as outlined above  No unusual exposure hx or h/o childhood pna/ asthma or knowledge of premature birth.  Current Allergies, Complete Past Medical History, Past Surgical History, Family History, and Social History were reviewed in [employer] record.  ROS  The following are not active complaints unless bolded Hoarseness, sore throat, dysphagia, dental problems, itching, sneezing,  nasal congestion or discharge of excess mucus or purulent secretions, ear ache,   fever, chills, sweats, unintended wt loss or wt gain, classically pleuritic or exertional cp,  orthopnea pnd or arm/hand swelling  or leg swelling, presyncope, palpitations, abdominal pain, anorexia, nausea, vomiting, diarrhea  or change in bowel habits or change in bladder habits, change in stools or change in urine, dysuria, hematuria,  rash, arthralgias, visual complaints, headache, numbness, weakness  or ataxia or problems with walking or coordination,  change in mood or  memory.        Current Meds  Medication Sig   albuterol (PROAIR HFA) 108 (90 Base) MCG/ACT inhaler 2 puffs every 4 hours as needed only  if your can't catch your breath   aspirin EC 81 MG tablet Take 81 mg by mouth daily.   BREZTRI AEROSPHERE 160-9-4.8 MCG/ACT AERO    famotidine (PEPCID) 20 MG tablet One after supper   icosapent Ethyl (VASCEPA) 1 g capsule Take 2 capsules (2 g total) by mouth 2 (two) times daily.   olmesartan (BENICAR) 40 MG tablet Take 40 mg by mouth daily.   pantoprazole (PROTONIX) 40 MG tablet Take 1 tablet (40 mg total) by mouth daily. Take 30-60 min before first meal of the day   rosuvastatin (CRESTOR) 10 MG tablet Take 1 tablet (10 mg total) by mouth daily.               Past Medical History:  Diagnosis Date   Ascending aortic aneurysm (HCC) 04/25/2023   CAD in native artery 04/25/2023   Elevated liver enzymes    Elevated triglycerides with high cholesterol    Hypertension    Hypertriglyceridemia 04/25/2023   S/P tooth extraction 01/2013   for dentures   Tobacco use         Objective:     06/19/2023       199  05/02/23 202 lb 9.6 oz (91.9 kg)  04/25/23 206 lb 4.8 oz (93.6 kg)  03/25/23 215 lb (97.5 kg)      Vital signs reviewed  06/19/2023  - Note at rest 02 sats  95% on RA   General appearance:    amb (with cane) wf nad     HEENT : Oropharynx  clear  Nasal turbinates nl    NECK :  without  apparent JVD/ palpable Nodes/TM    LUNGS: no acc muscle use,  Min barrel  contour chest wall with bilateral  slightly decreased bs s audible wheeze and  without cough on insp or exp maneuvers and min  Hyperresonant  to  percussion bilaterally    CV:  RRR  no s3 or murmur or increase in P2, and no edema   ABD:  soft and nontender with pos end  insp Hoover's  in the supine position.  No bruits or organomegaly appreciated   MS:  Nl gait/ ext warm without deformities Or obvious joint  restrictions  calf tenderness, cyanosis or clubbing     SKIN: warm and dry without lesions    NEURO:  alert, approp, nl sensorium with  no motor or cerebellar deficits apparent.  I personally reviewed images and agree with radiology impression as follows:   Chest CT 04/09/23    Emphysema    Assessment

## 2023-06-19 NOTE — Patient Instructions (Signed)
My office will be contacting you by phone for referral to orthopedics and sinus CT   - if you don't hear back from my office within one week please call us back or notify us thru MyChart and we'll address it right away.   Work on inhaler technique:  relax and gently blow all the way out then take a nice smooth full deep breath back in, triggering the inhaler at same time you start breathing in.  Hold breath in for at least  5 seconds if you can. Blow out breztri  thru nose. Rinse and gargle with water when done.  If mouth or throat bother you at all,  try brushing teeth/gums/tongue with arm and hammer toothpaste/ make a slurry and gargle and spit out.   >>>  Remember how golfers warm up by taking practice swings - do this with an empty inhaler    The key is to stop smoking completely before smoking completely stops you!  Please schedule a follow up visit in 6 months but call sooner if needed

## 2023-06-19 NOTE — Assessment & Plan Note (Addendum)
Referred to Ortho >>> - xrays c/w osteoarthitis 03/20/22   No better with injections or nsaids to day > refer to Ortho / Dr Romeo Apple

## 2023-06-19 NOTE — Assessment & Plan Note (Signed)
Sinus CT 06/19/2023 >>>   Improved some p abx > sinus ct and f/u ent prn

## 2023-06-19 NOTE — Assessment & Plan Note (Signed)
Counseled re importance of smoking cessation but did not meet time criteria for separate billing     F/u q 6 m, sooner prn         Each maintenance medication was reviewed in detail including emphasizing most importantly the difference between maintenance and prns and under what circumstances the prns are to be triggered using an action plan format where appropriate.  Total time for H and P, chart review, counseling, reviewing hfa device(s) and generating customized AVS unique to this office visit / same day charting = 34 min

## 2023-06-19 NOTE — Assessment & Plan Note (Signed)
Active smoker/MM - emphysema on CT 04/09/23  - Labs ordered 05/02/2023  :  allergy screen Eos 0.1   alpha one AT phenotype  MM  level 159  - 05/02/2023  After extensive coaching inhaler device,  effectiveness =    60% (short ti) > continue breztri/ rx rhinitis/ sinusitis with augmentin and empirical gerd x 6 weeks then regroup - PFT's  05/31/23  FEV1 1.54 (54 % ) ratio 0.66  p 0 % improvement from saba p no Breztri prior to study with DLCO  13.37 (66%)   and FV curve mild but classically concave  and ERV 51% at wt 197   - 06/19/2023  After extensive coaching inhaler device,  effectiveness =   50% > continue breztri    Group D (now reclassified as E) in terms of symptom/risk and laba/lama/ICS  therefore appropriate rx at this point >>>  breztri and approp saba:    Re SABA :  I spent extra time with pt today reviewing appropriate use of albuterol for prn use on exertion with the following points: 1) saba is for relief of sob that does not improve by walking a slower pace or resting but rather if the pt does not improve after trying this first. 2) If the pt is convinced, as many are, that saba helps recover from activity faster then it's easy to tell if this is the case by re-challenging : ie stop, take the inhaler, then p 5 minutes try the exact same activity (intensity of workload) that just caused the symptoms and see if they are substantially diminished or not after saba 3) if there is an activity that reproducibly causes the symptoms, try the saba 15 min before the activity on alternate days   If in fact the saba really does help, then fine to continue to use it prn but advised may need to look closer at the maintenance regimen being used to achieve better control of airways disease with exertion.

## 2023-07-03 ENCOUNTER — Ambulatory Visit (HOSPITAL_COMMUNITY)
Admission: RE | Admit: 2023-07-03 | Discharge: 2023-07-03 | Disposition: A | Discharge status: Home / Self Care | Payer: Medicare Other | Source: Ambulatory Visit | Attending: Internal Medicine | Admitting: Internal Medicine

## 2023-07-03 DIAGNOSIS — J329 Chronic sinusitis, unspecified: Secondary | ICD-10-CM | POA: Diagnosis not present

## 2023-07-04 ENCOUNTER — Other Ambulatory Visit (INDEPENDENT_AMBULATORY_CARE_PROVIDER_SITE_OTHER): Payer: Medicare Other

## 2023-07-04 ENCOUNTER — Encounter (HOSPITAL_BASED_OUTPATIENT_CLINIC_OR_DEPARTMENT_OTHER): Payer: Self-pay

## 2023-07-04 ENCOUNTER — Ambulatory Visit: Payer: Medicare Other | Admitting: Orthopedic Surgery

## 2023-07-04 ENCOUNTER — Encounter: Payer: Self-pay | Admitting: Orthopedic Surgery

## 2023-07-04 VITALS — BP 165/79 | HR 89 | Ht 66.0 in | Wt 200.0 lb

## 2023-07-04 DIAGNOSIS — M1611 Unilateral primary osteoarthritis, right hip: Secondary | ICD-10-CM

## 2023-07-04 DIAGNOSIS — M25551 Pain in right hip: Secondary | ICD-10-CM

## 2023-07-04 DIAGNOSIS — S73191A Other sprain of right hip, initial encounter: Secondary | ICD-10-CM

## 2023-07-04 MED ORDER — MELOXICAM 7.5 MG PO TABS
7.5000 mg | ORAL_TABLET | Freq: Every day | ORAL | 5 refills | Status: AC
Start: 2023-07-04 — End: ?

## 2023-07-04 NOTE — Patient Instructions (Signed)
Physical therapy has been ordered for you at [facility] They should call you to schedule, [phone]  is the phone number to call, if you want to call to schedule.   

## 2023-07-04 NOTE — Progress Notes (Signed)
Office Visit Note   Patient: Cynthia [name]           Date of Birth: [birth year]           MRN: [id number] Visit Date: 07/04/2023 Requested by: Nyoka Cowden, MD [address] [place],  [place] [address] PCP: Assunta Found, MD   Assessment & Plan: 67 year old female groin pain no prior treatment with NSAIDs or therapy possible labral tear as the x-ray is not very remarkable.  There is some mild degenerative changes but there is excellent joint space in the hip she may have some femoral acetabular impingement which can lead to labral tears  We will put her in therapy put her on anti-inflammatories for 6 weeks and then if no improvement we can proceed with MRI of the right hip with intra-articular contrast to rule out labral tear  Meds ordered this encounter  Medications   meloxicam (MOBIC) 7.5 MG tablet    Sig: Take 1 tablet (7.5 mg total) by mouth daily.    Dispense:  30 tablet    Refill:  5     Subjective: Chief Complaint  Patient presents with   Hip Pain    Right groin/ hip pain for long duration several months especially when walking or on stairs.     HPI: 67 year old female with chronic right groin pain exacerbated by ambulation and walking up inclines.  The pain is in the crease of the hip anteriorly.  She has not had any physical therapy or anti-inflammatories she did get 2 injections              ROS: Positive findings include chest pain cough and the hip pain all other systems were reportedly negative by the patient   Images personally read and my interpretation : First image was lumbar spine films x 5 she has degenerative disc disease in the lumbar spine  Second set of films was her hip film which does not really show any changes  I took a new hip films since the last 1 was from 2023  Visit Diagnoses:  1. Pain in right hip   2. Arthritis of right hip   3. Tear of right acetabular labrum, initial encounter      Follow-Up Instructions: Return for  FOLLOW UP, RIGHT, HIP after 12 therapy visits (call after 12 th PT visit).    Objective: Vital Signs: BP (!) 165/79   Pulse 89   Ht 5\' 6"  (1.676 m)   Wt 200 lb (90.7 kg)   BMI 32.28 kg/m   Physical Exam Vitals and nursing note reviewed.  Constitutional:      Appearance: Normal appearance.  HENT:     Head: Normocephalic and atraumatic.  Eyes:     General: No scleral icterus.       Right eye: No discharge.        Left eye: No discharge.     Extraocular Movements: Extraocular movements intact.     Conjunctiva/sclera: Conjunctivae normal.     Pupils: Pupils are equal, round, and reactive to light.  Cardiovascular:     Rate and Rhythm: Normal rate.     Pulses: Normal pulses.     Comments: Skin pigment changes  Musculoskeletal:     Right lower leg: Edema present.     Left lower leg: Edema present.  Skin:    General: Skin is warm and dry.     Capillary Refill: Capillary refill takes less than 2 seconds.  Neurological:  General: No focal deficit present.     Mental Status: She is alert and oriented to person, place, and time.  Psychiatric:        Mood and Affect: Mood normal.        Behavior: Behavior normal.        Thought Content: Thought content normal.        Judgment: Judgment normal.      Right Hip Exam   Tenderness  The patient is experiencing no tenderness.   Range of Motion  Abduction:  normal  Adduction:  normal  Extension:  normal  Flexion:  normal  External rotation:  abnormal  Internal rotation:  abnormal   Muscle Strength  Abduction: 5/5  Adduction: 5/5  Flexion: 5/5   Tests  FABER: negative  Other  Erythema: absent Scars: absent Sensation: normal Pulse: present  Comments:  Hyperemia and edema both lower extremities    Left Hip Exam  Left hip exam is normal.       Specialty Comments:  No specialty comments available.  Imaging: DG HIP UNILAT WITH PELVIS 2-3 VIEWS RIGHT  Result Date: 07/04/2023 [facility]foot  Imaging Radiology Report Dictated by Dr. Romeo Apple Chief complaint pain right groin Images: Pelvis AP lateral right hip There may be some mild degenerative changes in the hip but for the most part she has an intact femoral head and no significant joint space narrowing    PMFS History: Patient Active Problem List   Diagnosis Date Noted   Chronic sinusitis 06/19/2023   Hip pain, chronic, right 06/19/2023   Cigarette smoker 05/02/2023   CAD in native artery 04/25/2023   Hypertriglyceridemia 04/25/2023   Ascending aortic aneurysm (HCC) 04/25/2023   Leukocytosis 10/04/2021   Obesity 10/04/2021   Confusion 10/04/2021   Acute on chronic respiratory failure with hypoxia and hypercapnia (HCC) 10/04/2021   Acute hyponatremia 10/01/2021   COPD with acute exacerbation (HCC) 10/01/2021   Tobacco abuse 02/24/2018   Obstructive chronic bronchitis with exacerbation (HCC) 02/24/2018   CAP (community acquired pneumonia) 02/22/2018   Hyponatremia 02/22/2018   Essential hypertension 02/22/2018   Transaminitis 02/22/2018   COPD GOLD 2/ AB 02/22/2018   Atypical lymphocytes present on peripheral blood smear 02/22/2018   Essential hypertension 02/22/2018   Past Medical History:  Diagnosis Date   Ascending aortic aneurysm (HCC) 04/25/2023   CAD in native artery 04/25/2023   Elevated liver enzymes    Elevated triglycerides with high cholesterol    Hypertension    Hypertriglyceridemia 04/25/2023   S/P tooth extraction 01/2013   for dentures   Tobacco use     Family History  Problem Relation Age of Onset   Diabetes Mother    Atrial fibrillation Mother    Alzheimer's disease Mother    Heart attack Father    Stroke Maternal Grandmother     Past Surgical History:  Procedure Laterality Date   ABDOMINAL HYSTERECTOMY     2 partial   Social History   Occupational History   Not on file  Tobacco Use   Smoking status: Every Day    Current packs/day: 2.00    Average packs/day: 2.0 packs/day for  43.0 years (86.0 ttl pk-yrs)    Types: Cigarettes    Start date: 10/13/1980   Smokeless tobacco: Never  Vaping Use   Vaping status: Never Used  Substance and Sexual Activity   Alcohol use: No   Drug use: No   Sexual activity: Yes

## 2023-08-05 DIAGNOSIS — I10 Essential (primary) hypertension: Secondary | ICD-10-CM | POA: Diagnosis not present

## 2023-08-05 DIAGNOSIS — I2510 Atherosclerotic heart disease of native coronary artery without angina pectoris: Secondary | ICD-10-CM | POA: Diagnosis not present

## 2023-08-05 DIAGNOSIS — Z5181 Encounter for therapeutic drug level monitoring: Secondary | ICD-10-CM | POA: Diagnosis not present

## 2023-08-05 DIAGNOSIS — E781 Pure hyperglyceridemia: Secondary | ICD-10-CM | POA: Diagnosis not present

## 2023-08-06 LAB — COMPREHENSIVE METABOLIC PANEL
ALT: 13 IU/L (ref 0–32)
AST: 16 IU/L (ref 0–40)
Alkaline Phosphatase: 72 IU/L (ref 44–121)
Bilirubin Total: 0.5 mg/dL (ref 0.0–1.2)
Globulin, Total: 2.3 g/dL (ref 1.5–4.5)
Glucose: 111 mg/dL — ABNORMAL HIGH (ref 70–99)
Total Protein: 6.5 g/dL (ref 6.0–8.5)
eGFR: 81 mL/min/{1.73_m2} (ref >59)

## 2023-08-06 LAB — LIPID PANEL: Chol/HDL Ratio: 2 ratio (ref 0.0–4.4)

## 2023-11-01 DIAGNOSIS — Z23 Encounter for immunization: Secondary | ICD-10-CM | POA: Diagnosis not present

## 2023-11-01 DIAGNOSIS — I10 Essential (primary) hypertension: Secondary | ICD-10-CM | POA: Diagnosis not present

## 2023-11-11 ENCOUNTER — Ambulatory Visit (INDEPENDENT_AMBULATORY_CARE_PROVIDER_SITE_OTHER): Payer: Medicare Other | Admitting: Cardiovascular Disease

## 2023-11-11 ENCOUNTER — Encounter (HOSPITAL_BASED_OUTPATIENT_CLINIC_OR_DEPARTMENT_OTHER): Payer: Self-pay | Admitting: Cardiovascular Disease

## 2023-11-11 VITALS — BP 148/78 | HR 70 | Ht 65.0 in | Wt 216.1 lb

## 2023-11-11 DIAGNOSIS — I2510 Atherosclerotic heart disease of native coronary artery without angina pectoris: Secondary | ICD-10-CM | POA: Diagnosis not present

## 2023-11-11 DIAGNOSIS — R609 Edema, unspecified: Secondary | ICD-10-CM

## 2023-11-11 DIAGNOSIS — E781 Pure hyperglyceridemia: Secondary | ICD-10-CM

## 2023-11-11 DIAGNOSIS — I7121 Aneurysm of the ascending aorta, without rupture: Secondary | ICD-10-CM

## 2023-11-11 DIAGNOSIS — Z5181 Encounter for therapeutic drug level monitoring: Secondary | ICD-10-CM

## 2023-11-11 DIAGNOSIS — I10 Essential (primary) hypertension: Secondary | ICD-10-CM | POA: Diagnosis not present

## 2023-11-11 DIAGNOSIS — Z720 Tobacco use: Secondary | ICD-10-CM

## 2023-11-11 MED ORDER — SPIRONOLACTONE 25 MG PO TABS: 25.0000 mg | ORAL_TABLET | Freq: Every day | ORAL | 3 refills | Status: DC

## 2023-11-11 NOTE — Progress Notes (Addendum)
Cardiology Office Note:  .   Date:  11/11/2023  ID:  Cynthia [name], DOB [birth year], MRN [id number] PCP: Cynthia Found, MD  [facility] Providers Cardiologist:  None    History of Present Illness: .    Cynthia [name] is a 67 y.o. female with a hx of moderate CAD, mild ascending aorta aneurysm, hypertension, hyperlipidemia, COPD, tobacco abuse, obesity, sciatica, and osteoarthritis, here for follow up.  She was seen 04/2023 for the evaluation of dilated aortic root. She was seen by Dr. Phillips Odor on 03/25/2023 for follow-up. Adrenal insufficiency and a smoking history of 52 pack years with COPD was noted. She was still smoking. She had a CT scan of the chest 04/09/2023 revealing extensive coronary artery calcifications, aortic atherosclerosis, and fusiform dilatation of the ascending aorta with maximal dimension 4.1 cm. Emphysema, hepatic steatosis, and small benign pulmonary nodules were also noted. Prior echocardiogram 02/2018 showed LVEF 60-65%, mild LVH, mild mitral regurgitation, and mildly increased systolic pressure of the pulmonary arteries.  At her appointment 04/2023 she was feeling generally well.  Given her extensive CAD, she was referred for coronary CTA which revealed a calcium score of 2252 which was 99th percentile.  She had moderate stenosis in the RCA and mild disease in the LAD.  There was no significant disease by FFR.  Fenofibrate was switched to Vascepa due to her concern for her gallbladder.  Discussed the use of AI scribe software for clinical note transcription with the patient, who gave verbal consent to proceed.  History of Present Illness   [name] presents with concerns about persistent leg swelling and redness. She denies any recent blisters, which were a problem in the past. The patient also reports occasional shortness of breath, particularly in humid weather, but denies any chest pain or dyspnea when lying down.  It improves with use of her inhaler.  She has  been active, working in her yard, without any exacerbation of symptoms.  [name] also reports a recent increase in blood pressure readings, with values ranging from 142 to 167, despite being on antihypertensive medication. She expresses dissatisfaction with her current primary care provider and is seeking a new one.  She has a history of low sodium and chloride levels when previously prescribed hydrochlorothiazide, leading to hospitalization. She is currently not on this medication due to these side effects.  Lastly, the patient mentions a recent onset of neck pain, which she attributes to a possible muscle spasm or sleeping in an awkward position. The pain is localized and sensitive to touch but has been improving with the application of heat.     ROS:  As per HPI  Studies Reviewed: Cynthia [name]       Coronary CT-A 04/2023: IMPRESSION: 1. Coronary calcium score of 2252. This was 76 percentile for age and sex matched control.   2. Normal coronary origin with right dominance.   3. CAD-RADS 3. Moderate stenosis. Consider symptom-guided anti-ischemic pharmacotherapy as well as risk factor modification per guideline directed care. Additional analysis with CT FFR will be submitted.    Risk Assessment/Calculations:     HYPERTENSION CONTROL Vitals:   11/11/23 1015 11/11/23 1041  BP: 132/80 (!) 148/78    The patient's blood pressure is elevated above target today.  In order to address the patient's elevated BP: A new medication was prescribed today.          Physical Exam:    VS:  BP (!) 148/78   Pulse 70   Ht  5\' 5"  (1.651 m)   Wt 216 lb 1.6 oz (98 kg)   SpO2 91%   BMI 35.96 kg/m  , BMI Body mass index is 35.96 kg/m. GENERAL:  Well appearing HEENT: Pupils equal round and reactive, fundi not visualized, oral mucosa unremarkable NECK:  No jugular venous distention, waveform within normal limits, carotid upstroke brisk and symmetric, no bruits, no thyromegaly LUNGS:  Clear to  auscultation bilaterally HEART:  RRR.  PMI not displaced or sustained,S1 and S2 within normal limits, no S3, no S4, no clicks, no rubs, no murmurs ABD:  Flat, positive bowel sounds normal in frequency in pitch, no bruits, no rebound, no guarding, no midline pulsatile mass, no hepatomegaly, no splenomegaly EXT:  2 plus pulses throughout, no edema, no cyanosis no clubbing SKIN:  No rashes no nodules NEURO:  Cranial nerves II through XII grossly intact, motor grossly intact throughout PSYCH:  Cognitively intact, oriented to person place and time  ASSESSMENT AND PLAN: .    # LE Edema: # Venous Stasis Dermatitis#  Chronic lower extremity edema with skin changes. No shortness of breath or orthopnea. Last echocardiogram in 2019 was normal. -Order repeat echocardiogram to assess for diastolic dysfunction -Start Spironolactone 25mg  daily to help manage fluid retention and BP -Check BMP in 1 week  # Hypertension Recent readings have been elevated, with a reading of 148/76 today. -Continue current antihypertensive regimen. -Check blood pressure in 1-2 months to assess control.  # Neck Pain Likely muscle spasm.  No lymphadenopathy -Advise heat application.     # CAD:  # Hyperlipidemia:  No ischemic symptoms.  Lipids are well-controlled.  Continue aspirin, Vascepa, and rosuvastatin.  Addendum: Consider SGLT2 inhibitor if HFpEF on echo.   Dispo: f/u in 2 months with APP.  Cynthia Hornbrook C. Duke Salvia, MD, [facility] in 6 months.  Signed, Chilton Si, MD

## 2023-11-11 NOTE — Patient Instructions (Addendum)
Medication Instructions:  START SPIRONOLACTONE 25 MG DAILY   *If you need a refill on your cardiac medications before your next appointment, please call your pharmacy*  Lab Work: BMET IN 1 WEEK   If you have labs (blood work) drawn today and your tests are completely normal, you will receive your results only by: MyChart Message (if you have MyChart) OR A paper copy in the mail If you have any lab test that is abnormal or we need to change your treatment, we will call you to review the results.  Testing/Procedures: Your physician has requested that you have an echocardiogram. Echocardiography is a painless test that uses sound waves to create images of your heart. It provides your doctor with information about the size and shape of your heart and how well your heart's chambers and valves are working. This procedure takes approximately one hour. There are no restrictions for this procedure. Please do NOT wear cologne, perfume, aftershave, or lotions (deodorant is allowed). Please arrive 15 minutes prior to your appointment time.  Please note: We ask at that you not bring children with you during ultrasound (echo/ vascular) testing. Due to room size and safety concerns, children are not allowed in the ultrasound rooms during exams. Our front office staff cannot provide observation of children in our lobby area while testing is being conducted. An adult accompanying a patient to their appointment will only be allowed in the ultrasound room at the discretion of the ultrasound technician under special circumstances. We apologize for any inconvenience.  AT [place]   Follow-Up: At [facility], you and your health needs are our priority.  As part of our continuing mission to provide you with exceptional heart care, we have created designated Provider Care Teams.  These Care Teams include your primary Cardiologist (physician) and Advanced Practice Providers (APPs -  Physician Assistants and  Nurse Practitioners) who all work together to provide you with the care you need, when you need it.  We recommend signing up for the patient portal called "MyChart".  Sign up information is provided on this After Visit Summary.  MyChart is used to connect with patients for Virtual Visits (Telemedicine).  Patients are able to view lab/test results, encounter notes, upcoming appointments, etc.  Non-urgent messages can be sent to your provider as well.   To learn more about what you can do with MyChart, go to [email or web address].    Your next appointment:   2 month(s)  Provider:   Gillian Shields, NP   DR [facility] IN 6 MONTHS

## 2023-11-18 DIAGNOSIS — Z5181 Encounter for therapeutic drug level monitoring: Secondary | ICD-10-CM | POA: Diagnosis not present

## 2023-11-18 DIAGNOSIS — I10 Essential (primary) hypertension: Secondary | ICD-10-CM | POA: Diagnosis not present

## 2023-11-19 ENCOUNTER — Ambulatory Visit (HOSPITAL_COMMUNITY)
Admission: RE | Admit: 2023-11-19 | Discharge: 2023-11-19 | Disposition: A | Discharge status: Home / Self Care | Payer: Medicare Other | Source: Ambulatory Visit | Attending: Cardiovascular Disease | Admitting: Cardiovascular Disease

## 2023-11-19 DIAGNOSIS — I10 Essential (primary) hypertension: Secondary | ICD-10-CM | POA: Diagnosis not present

## 2023-11-19 DIAGNOSIS — R609 Edema, unspecified: Secondary | ICD-10-CM | POA: Insufficient documentation

## 2023-11-19 LAB — ECHOCARDIOGRAM COMPLETE
AR max vel: 2.39 cm2
AV Area VTI: 2.81 cm2
AV Area mean vel: 2.44 cm2
AV Mean grad: 4.5 mm[Hg]
AV Peak grad: 9.1 mm[Hg]
Ao pk vel: 1.51 m/s
Area-P 1/2: 3.03 cm2
Calc EF: 63 %
MV VTI: 4.33 cm2
S' Lateral: 2.9 cm
Single Plane A2C EF: 66.3 %
Single Plane A4C EF: 61 %

## 2023-11-19 LAB — BASIC METABOLIC PANEL
BUN/Creatinine Ratio: 14 (ref 12–28)
BUN: 12 mg/dL (ref 8–27)
CO2: 23 mmol/L (ref 20–29)
Calcium: 9.3 mg/dL (ref 8.7–10.3)
Chloride: 96 mmol/L (ref 96–106)
Creatinine, Ser: 0.84 mg/dL (ref 0.57–1.00)
Glucose: 141 mg/dL — ABNORMAL HIGH (ref 70–99)
Potassium: 5.4 mmol/L — ABNORMAL HIGH (ref 3.5–5.2)
Sodium: 138 mmol/L (ref 134–144)
eGFR: 76 mL/min/{1.73_m2} (ref >59)

## 2023-11-19 NOTE — Progress Notes (Signed)
  Echocardiogram 2D Echocardiogram has been performed.  Ocie Doyne RDCS 11/19/2023, 8:41 AM

## 2023-11-27 ENCOUNTER — Encounter (HOSPITAL_BASED_OUTPATIENT_CLINIC_OR_DEPARTMENT_OTHER): Payer: Self-pay

## 2023-11-27 DIAGNOSIS — E876 Hypokalemia: Secondary | ICD-10-CM

## 2023-11-29 DIAGNOSIS — E876 Hypokalemia: Secondary | ICD-10-CM | POA: Diagnosis not present

## 2023-11-30 LAB — BASIC METABOLIC PANEL
BUN/Creatinine Ratio: 26 (ref 12–28)
BUN: 21 mg/dL (ref 8–27)
CO2: 23 mmol/L (ref 20–29)
Calcium: 9.2 mg/dL (ref 8.7–10.3)
Chloride: 96 mmol/L (ref 96–106)
Creatinine, Ser: 0.8 mg/dL (ref 0.57–1.00)
Glucose: 113 mg/dL — ABNORMAL HIGH (ref 70–99)
Potassium: 5 mmol/L (ref 3.5–5.2)
Sodium: 135 mmol/L (ref 134–144)
eGFR: 81 mL/min/{1.73_m2} (ref >59)

## 2023-12-15 NOTE — Progress Notes (Deleted)
 Cynthia [name], female    DOB: 01/27/1956    MRN: [id number]   Brief patient profile:  35  yowf  active smoker  referred to pulmonary clinic in [place]  05/02/2023 by Cynthia [name] COME  for recurrent pna x 2019 and new cough x  March 2024      History of Present Illness  05/02/2023  Pulmonary/ 1st [name] eval/ Cynthia [name]  on Breztri   Chief Complaint  Patient presents with   Consult    Pt consult referred by PCP for COPD evaluation, she currently has a productive cuogh w/ yellow sputum. Smokes 1-2ppd and is trying to quit. She reports that her O2 sats had been low but have since improved  Dyspnea:  limited by R hip pain / walks half mile around [place] x 30 min plus [employer] bike has to stop at of hills  Cough: first thing in am yellow/ thick no recent abx  x 30 min  Sleep: bed is flat/ lots of pillows  SABA use: not using hfa much/ uses neb more  02: none  Gen bilateral lower chest and abd pain just when coughing  Rec The key is to stop smoking completely before smoking completely stops you!  Augmentin  875 mg take one pill twice daily  X 10 days  Cough  >  Mucinex  dm 1200 mg twice daily as needed  Plan A = Automatic = Always=    Breztri  Take 2 puffs first thing in am and then another 2 puffs about 12 hours later.  Work on inhaler technique:   Plan B = Backup (to supplement plan A, not to replace it) Only use your albuterol  inhaler as a rescue medication Plan C = Crisis (instead of Plan B but only if Plan B stops working) - only use your albuterol  nebulizer if you first try Plan B and it fails to help > ok to use the nebulizer up to every 4 hours but if start needing it regularly call for immediate appointment Pantoprazole  (protonix ) 40 mg   Take  30-60 min before first meal of the day and Pepcid  (famotidine )  20 mg after supper until return to [name] GERD diet reviewed, bed blocks rec     Please remember to go to the lab department  allergy screen Eos 0.1   alpha  one AT phenotype  MM  level 159   Please schedule a follow up [name] visit in 6 weeks, call sooner if needed    06/19/2023  f/u ov/Page [name]/Cynthia [name] re: GOLD 2/ AB maint on breztri    Chief Complaint  Patient presents with   Follow-up    Cough is some better- still occ yellow sputum. Breathing is overall doing well and has not needed her albuterol .   Dyspnea:  limited by R hip and no better with injections/ some better with nsaids  Cough: mostly in am x 1 hour, yellow Sleeping: flat bed / bunch of pillows SABA use: not using either  02: none  Lung cancer screening: 03/3023 reviewed Rec My [name] will be contacting you by phone for referral to orthopedics and sinus CT   Work on inhaler technique:   >>>  Remember how golfers warm up by taking practice swings - do this with an empty inhaler  The key is to stop smoking completely before smoking completely stops you!  Please schedule a follow up visit in 6 months but call sooner if needed     12/16/2023  66m f/u ov/Cynthia [name]/Cynthia [name]  re: GOLD 3 copd /AB maint on ***  No chief complaint on file.   Dyspnea:  *** Cough: *** Sleeping: ***   resp cc  SABA use: *** 02: ***  Lung cancer screening: ***   No obvious day to day or daytime variability or assoc excess/ purulent sputum or mucus plugs or hemoptysis or cp or chest tightness, subjective wheeze or overt sinus or hb symptoms.    Also denies any obvious fluctuation of symptoms with weather or environmental changes or other aggravating or alleviating factors except as outlined above   No unusual exposure hx or h/o childhood pna/ asthma or knowledge of premature birth.  Current Allergies, Complete Past Medical History, Past Surgical History, Family History, and Social History were reviewed in [employer] record.  ROS  The following are not active complaints unless bolded Hoarseness, sore throat, dysphagia, dental problems, itching, sneezing,  nasal  congestion or discharge of excess mucus or purulent secretions, ear ache,   fever, chills, sweats, unintended wt loss or wt gain, classically pleuritic or exertional cp,  orthopnea pnd or arm/hand swelling  or leg swelling, presyncope, palpitations, abdominal pain, anorexia, nausea, vomiting, diarrhea  or change in bowel habits or change in bladder habits, change in stools or change in urine, dysuria, hematuria,  rash, arthralgias, visual complaints, headache, numbness, weakness or ataxia or problems with walking or coordination,  change in mood or  memory.        No outpatient medications have been marked as taking for the 12/16/23 encounter (Appointment) with Cynthia Ozell NOVAK, MD.             Past Medical History:  Diagnosis Date   Ascending aortic aneurysm (HCC) 04/25/2023   CAD in native artery 04/25/2023   Elevated liver enzymes    Elevated triglycerides with high cholesterol    Hypertension    Hypertriglyceridemia 04/25/2023   S/P tooth extraction 01/2013   for dentures   Tobacco use         Objective:    Wts  12/16/2023          ***  06/19/2023       199  05/02/23 202 lb 9.6 oz (91.9 kg)  04/25/23 206 lb 4.8 oz (93.6 kg)  03/25/23 215 lb (97.5 kg)    Vital signs reviewed  12/16/2023  - Note at rest 02 sats  ***% on ***   General appearance:    ***     Min bar***        Assessment

## 2023-12-16 ENCOUNTER — Telehealth: Payer: Self-pay | Admitting: Internal Medicine

## 2023-12-16 ENCOUNTER — Ambulatory Visit: Payer: Medicare Other | Admitting: Internal Medicine

## 2023-12-16 NOTE — Telephone Encounter (Signed)
 Left voice mail cancelling the 8:30 am appointment with Dr. Sherene Sires due to weather conditions.  WIll call patient later today to reschedule.

## 2024-01-15 NOTE — Progress Notes (Signed)
 Cynthia [name], female    DOB: 09-Mar-1956    MRN: [id number]   Brief patient profile:  42  yowf  active smoker  referred to pulmonary clinic in [place]  05/02/2023 by Norleen Marvine COME  for recurrent pna x 2019 and new cough x  March 2024      History of Present Illness  05/02/2023  Pulmonary/ 1st office eval/ [place] / [place] [facility]  on Breztri   Chief Complaint  Patient presents with   Consult    Pt consult referred by PCP for COPD evaluation, she currently has a productive cuogh w/ yellow sputum. Smokes 1-2ppd and is trying to quit. She reports that her O2 sats had been low but have since improved  Dyspnea:  limited by R hip pain / walks half mile around [place] x 30 min plus station[employer] bike has to stop at of hills  Cough: first thing in am yellow/ thick no recent abx  x 30 min  Sleep: bed is flat/ lots of pillows  SABA use: not using hfa much/ uses neb more  02: none  Gen bilateral lower chest and abd pain just when coughing  Rec The key is to stop smoking completely before smoking completely stops you!  Augmentin  875 mg take one pill twice daily  X 10 days  Cough  >  Mucinex  dm 1200 mg twice daily as needed  Plan A = Automatic = Always=    Breztri  Take 2 puffs first thing in am and then another 2 puffs about 12 hours later.  Work on inhaler technique:   Plan B = Backup (to supplement plan A, not to replace it) Only use your albuterol  inhaler as a rescue medication Plan C = Crisis (instead of Plan B but only if Plan B stops working) - only use your albuterol  nebulizer if you first try Plan B and it fails to help > ok to use the nebulizer up to every 4 hours but if start needing it regularly call for immediate appointment Pantoprazole  (protonix ) 40 mg   Take  30-60 min before first meal of the day and Pepcid  (famotidine )  20 mg after supper until return to office GERD diet reviewed, bed blocks rec    Please remember to go to the lab department  allergy screen Eos 0.1   alpha  one AT phenotype  MM  level 159      06/19/2023  f/u ov/[place] office/Burrell Hodapp re: GOLD 2/ AB maint on breztri    Chief Complaint  Patient presents with   Follow-up    Cough is some better- still occ yellow sputum. Breathing is overall doing well and has not needed her albuterol .   Dyspnea:  limited by R hip and no better with injections/ some better with nsaids  Cough: mostly in am x 1 hour, yellow Sleeping: flat bed / bunch of pillows SABA use: not using either  02: none  Lung cancer screening: 03/3023 reviewed Rec My office will be contacting you by phone for referral to orthopedics and sinus CT   Work on inhaler technique:   >>>  Remember how golfers warm up by taking practice swings - do this with an empty inhaler  The key is to stop smoking completely before smoking completely stops you!       01/16/2024  42m f/u ov/[place] office/Travonte Byard re: GOLD 2 copd /AB maint on no maint rx - thinks albuterol  works better tan anything  Chief Complaint  Patient presents with   Follow-up  6 month follow up   Dyspnea:  mb and back stops half way / 75 ft slt uphill  Cough: better off breztri   Sleeping: flat bed /pillows no resp cc  SABA use: hfa up to 4 x daily  02: none   Lung cancer screening: should be due in April 2025 l    No obvious day to day or daytime variability or assoc excess/ purulent sputum or mucus plugs or hemoptysis or cp or chest tightness, subjective wheeze or overt sinus or hb symptoms.    Also denies any obvious fluctuation of symptoms with weather or environmental changes or other aggravating or alleviating factors except as outlined above   No unusual exposure hx or h/o childhood pna/ asthma or knowledge of premature birth.  Current Allergies, Complete Past Medical History, Past Surgical History, Family History, and Social History were reviewed in [employer] record.  ROS  The following are not active complaints unless bolded Hoarseness,  sore throat, dysphagia, dental problems, itching, sneezing,  nasal congestion or discharge of excess mucus or purulent secretions, ear ache,   fever, chills, sweats, unintended wt loss or wt gain, classically pleuritic or exertional cp,  orthopnea pnd or arm/hand swelling  or leg swelling, presyncope, palpitations, abdominal pain, anorexia, nausea, vomiting, diarrhea  or change in bowel habits or change in bladder habits, change in stools or change in urine, dysuria, hematuria,  rash, arthralgias, visual complaints, headache, numbness, weakness or ataxia or problems with walking or coordination,  change in mood or  memory.        Current Meds  Medication Sig   albuterol  (PROAIR  HFA) 108 (90 Base) MCG/ACT inhaler 2 puffs every 4 hours as needed only  if your can't catch your breath   aspirin  EC 81 MG tablet Take 81 mg by mouth daily.   BREZTRI  AEROSPHERE 160-9-4.8 MCG/ACT AERO    famotidine  (PEPCID ) 20 MG tablet One after supper   icosapent  Ethyl (VASCEPA ) 1 g capsule Take 2 capsules (2 g total) by mouth 2 (two) times daily.   meloxicam  (MOBIC ) 7.5 MG tablet Take 1 tablet (7.5 mg total) by mouth daily.   olmesartan  (BENICAR ) 40 MG tablet Take 40 mg by mouth daily.   pantoprazole  (PROTONIX ) 40 MG tablet Take 1 tablet (40 mg total) by mouth daily. Take 30-60 min before first meal of the day   spironolactone  (ALDACTONE ) 25 MG tablet Take 1 tablet (25 mg total) by mouth daily.             Past Medical History:  Diagnosis Date   Ascending aortic aneurysm (HCC) 04/25/2023   CAD in native artery 04/25/2023   Elevated liver enzymes    Elevated triglycerides with high cholesterol    Hypertension    Hypertriglyceridemia 04/25/2023   S/P tooth extraction 01/2013   for dentures   Tobacco use         Objective:    Wts  01/16/2024          216  06/19/2023       199  05/02/23 202 lb 9.6 oz (91.9 kg)  04/25/23 206 lb 4.8 oz (93.6 kg)  03/25/23 215 lb (97.5 kg)    Vital signs reviewed   01/16/2024  - Note at rest 02 sats  90% on RA   General appearance:    obese wf upset about her boyfriend's death.    HEENT : Oropharynx  clear   Nasal turbinates nl    NECK :  without  apparent JVD/ palpable Nodes/TM    LUNGS: no acc muscle use,  Min barrel  contour chest wall with bilateral  slightly decreased bs s audible wheeze and  without cough on insp or exp maneuvers and min  Hyperresonant  to  percussion bilaterally    CV:  RRR  no s3 or murmur or increase in P2, and no edema   ABD:  soft and nontender with pos end  insp Hoover's  in the supine position.  No bruits or organomegaly appreciated   MS:  Nl gait/ ext warm without deformities Or obvious joint restrictions  calf tenderness, cyanosis or clubbing     SKIN: warm and dry with rubror both LE's knees down but no pitting(always better in am)     NEURO:  alert, approp, nl sensorium with  no motor or cerebellar deficits apparent.          CXR PA and Lateral:   01/16/2024 :    I personally reviewed images and impression is as follows:     Increase bronchial markings/ no acute findings     Assessment

## 2024-01-16 ENCOUNTER — Ambulatory Visit (INDEPENDENT_AMBULATORY_CARE_PROVIDER_SITE_OTHER): Payer: Medicare Other | Admitting: Internal Medicine

## 2024-01-16 ENCOUNTER — Encounter: Payer: Self-pay | Admitting: Internal Medicine

## 2024-01-16 ENCOUNTER — Ambulatory Visit (HOSPITAL_COMMUNITY)
Admission: RE | Admit: 2024-01-16 | Discharge: 2024-01-16 | Disposition: A | Discharge status: Home / Self Care | Payer: Medicare Other | Source: Ambulatory Visit | Attending: Internal Medicine | Admitting: Internal Medicine

## 2024-01-16 VITALS — BP 150/77 | HR 86 | Ht 65.0 in | Wt 216.0 lb

## 2024-01-16 DIAGNOSIS — J449 Chronic obstructive pulmonary disease, unspecified: Secondary | ICD-10-CM | POA: Insufficient documentation

## 2024-01-16 DIAGNOSIS — R0609 Other forms of dyspnea: Secondary | ICD-10-CM

## 2024-01-16 DIAGNOSIS — F17210 Nicotine dependence, cigarettes, uncomplicated: Secondary | ICD-10-CM

## 2024-01-16 DIAGNOSIS — J439 Emphysema, unspecified: Secondary | ICD-10-CM | POA: Diagnosis not present

## 2024-01-16 DIAGNOSIS — R058 Other specified cough: Secondary | ICD-10-CM | POA: Diagnosis not present

## 2024-01-16 DIAGNOSIS — R0602 Shortness of breath: Secondary | ICD-10-CM | POA: Diagnosis not present

## 2024-01-16 MED ORDER — METHYLPREDNISOLONE ACETATE 80 MG/ML IJ SUSP
80.0000 mg | Freq: Once | INTRAMUSCULAR | Status: AC
Start: 2024-01-16 — End: 2024-01-16
  Administered 2024-01-16: 80 mg via INTRAMUSCULAR

## 2024-01-16 NOTE — Patient Instructions (Addendum)
 Depomedrol 120 mg IM   Only use your albuterol  as a rescue medication to be used if you can't catch your breath by resting or doing a relaxed purse lip breathing pattern.  - The less you use it, the better it will work when you need it. - Ok to use up to 2 puffs  every 4 hours if you must but call for immediate appointment if use goes up over your usual need - Don't leave home without it !!  (think of it like the spare tire for your car)   Also  Ok to try albuterol  15 min before an activity (on alternating days)  that you know would usually make you short of breath and see if it makes any difference and if makes none then don't take albuterol  after activity unless you can't catch your breath as this means it's the resting that helps, not the albuterol .      Use your nebulizer up to every 4 hours if inhaler isn't working working for you and if this doesn't relieve your breathing difficulty then go to ER   Please remember to go to the lab department   for your tests - we will call you with the results when they are available.     Please remember to go to the  x-ray department  @  [facility] for your tests - we will call you with the results when they are available     Please schedule a follow up office visit in 2 weeks, sooner if needed  with all medications /inhalers/ solutions in hand so we can verify exactly what you are taking. This includes all medications from all doctors and over the counters

## 2024-01-17 NOTE — Assessment & Plan Note (Addendum)
 01/16/2024   Walked on R  x  2  lap(s) =  approx 300  ft  @ mod pace, stopped due to sob  with lowest 02 sats 87%  This is more walking than she usually does but she does qualify for ambulatory 02 if desired - advised    >>> ok to order portable 02 for next 30 d if desireds  >>> check bmet/bnp

## 2024-01-17 NOTE — Assessment & Plan Note (Signed)
 Active smoker/MM - emphysema on CT 04/09/23  - Labs ordered 05/02/2023  :  allergy screen Eos 0.1   alpha one AT phenotype  MM  level 159  - 05/02/2023  After extensive coaching inhaler device,  effectiveness =    60% (short ti) > continue breztri / rx rhinitis/ sinusitis with augmentin  and empirical gerd x 6 weeks then regroup - PFT's  05/31/23  FEV1 1.54 (54 % ) ratio 0.66  p 0 % improvement from saba p no Breztri  prior to study with DLCO  13.37 (66%)   and FV curve mild but classically concave  and ERV 51% at wt 197   - 06/19/2023  After extensive coaching inhaler device,  effectiveness =   50% > continue breztri   - 01/16/2024 pt d/c'd breztri  felt better off it.  Advised on dangers of over use of saba and should consider symb 80 2bid if continuing to use saba > bid on a regular basis   Depomedrol120 mg IM for mild flare today

## 2024-01-17 NOTE — Assessment & Plan Note (Addendum)
 Counseled re importance of smoking cessation but did not meet time criteria for separate billing    Low-dose CT lung cancer screening is recommended for patients who are 80-67 years of age with a 20+ pack-year history of smoking and who are currently smoking or quit <=15 years ago. No coughing up blood  No unintentional weight loss of > 15 pounds in the last 6 months - pt is eligible for scanning yearly until age 85  >  had ct chest 03/2023 so not due until 03/2024    F/u in 2 weeks with all meds in hand using a trust but verify approach to confirm accurate Medication  Reconciliation The principal here is that until we are certain that the  patients are doing what we've asked, it makes no sense to ask them to do more.   Each maintenance medication was reviewed in detail including emphasizing most importantly the difference between maintenance and prns and under what circumstances the prns are to be triggered using an action plan format where appropriate.  Total time for H and P, chart review, counseling, reviewing hfa/ neb  device(s) , directly observing portions of ambulatory 02 saturation study/ and generating customized AVS unique to this office visit / same day charting = 34 min

## 2024-01-18 LAB — BASIC METABOLIC PANEL
BUN/Creatinine Ratio: 14 (ref 12–28)
BUN: 12 mg/dL (ref 8–27)
CO2: 24 mmol/L (ref 20–29)
Calcium: 9.2 mg/dL (ref 8.7–10.3)
Chloride: 89 mmol/L — ABNORMAL LOW (ref 96–106)
Creatinine, Ser: 0.84 mg/dL (ref 0.57–1.00)
Glucose: 113 mg/dL — ABNORMAL HIGH (ref 70–99)
Potassium: 4.8 mmol/L (ref 3.5–5.2)
Sodium: 131 mmol/L — ABNORMAL LOW (ref 134–144)
eGFR: 76 mL/min/{1.73_m2} (ref >59)

## 2024-01-18 LAB — BRAIN NATRIURETIC PEPTIDE: BNP: 10.4 pg/mL (ref 0.0–100.0)

## 2024-01-20 ENCOUNTER — Encounter (HOSPITAL_COMMUNITY): Payer: Self-pay

## 2024-01-20 ENCOUNTER — Emergency Department (HOSPITAL_COMMUNITY): Payer: Medicare Other

## 2024-01-20 ENCOUNTER — Other Ambulatory Visit: Payer: Self-pay

## 2024-01-20 ENCOUNTER — Emergency Department (HOSPITAL_COMMUNITY)
Admission: EM | Admit: 2024-01-20 | Discharge: 2024-01-20 | Disposition: A | Discharge status: Home / Self Care | Payer: Medicare Other | Attending: Emergency Medicine | Admitting: Emergency Medicine

## 2024-01-20 DIAGNOSIS — E878 Other disorders of electrolyte and fluid balance, not elsewhere classified: Secondary | ICD-10-CM | POA: Insufficient documentation

## 2024-01-20 DIAGNOSIS — Z20822 Contact with and (suspected) exposure to covid-19: Secondary | ICD-10-CM | POA: Diagnosis not present

## 2024-01-20 DIAGNOSIS — E860 Dehydration: Secondary | ICD-10-CM | POA: Diagnosis not present

## 2024-01-20 DIAGNOSIS — J439 Emphysema, unspecified: Secondary | ICD-10-CM | POA: Diagnosis not present

## 2024-01-20 DIAGNOSIS — F17200 Nicotine dependence, unspecified, uncomplicated: Secondary | ICD-10-CM | POA: Diagnosis not present

## 2024-01-20 DIAGNOSIS — Z7982 Long term (current) use of aspirin: Secondary | ICD-10-CM | POA: Insufficient documentation

## 2024-01-20 DIAGNOSIS — E871 Hypo-osmolality and hyponatremia: Secondary | ICD-10-CM | POA: Insufficient documentation

## 2024-01-20 DIAGNOSIS — R0602 Shortness of breath: Secondary | ICD-10-CM | POA: Diagnosis not present

## 2024-01-20 DIAGNOSIS — D72829 Elevated white blood cell count, unspecified: Secondary | ICD-10-CM | POA: Insufficient documentation

## 2024-01-20 DIAGNOSIS — J449 Chronic obstructive pulmonary disease, unspecified: Secondary | ICD-10-CM | POA: Diagnosis not present

## 2024-01-20 DIAGNOSIS — I2510 Atherosclerotic heart disease of native coronary artery without angina pectoris: Secondary | ICD-10-CM | POA: Insufficient documentation

## 2024-01-20 DIAGNOSIS — J441 Chronic obstructive pulmonary disease with (acute) exacerbation: Secondary | ICD-10-CM | POA: Diagnosis not present

## 2024-01-20 HISTORY — DX: Chronic obstructive pulmonary disease, unspecified: J44.9

## 2024-01-20 LAB — COMPREHENSIVE METABOLIC PANEL
ALT: 40 U/L (ref 0–44)
AST: 50 U/L — ABNORMAL HIGH (ref 15–41)
Albumin: 4.1 g/dL (ref 3.5–5.0)
Alkaline Phosphatase: 49 U/L (ref 38–126)
Anion gap: 9 (ref 5–15)
BUN: 13 mg/dL (ref 8–23)
CO2: 28 mmol/L (ref 22–32)
Calcium: 9.1 mg/dL (ref 8.9–10.3)
Chloride: 91 mmol/L — ABNORMAL LOW (ref 98–111)
Creatinine, Ser: 0.69 mg/dL (ref 0.44–1.00)
GFR, Estimated: >60 mL/min (ref >60)
Glucose, Bld: 106 mg/dL — ABNORMAL HIGH (ref 70–99)
Potassium: 4.7 mmol/L (ref 3.5–5.1)
Sodium: 128 mmol/L — ABNORMAL LOW (ref 135–145)
Total Bilirubin: 0.7 mg/dL (ref 0.0–1.2)
Total Protein: 6.9 g/dL (ref 6.5–8.1)

## 2024-01-20 LAB — BASIC METABOLIC PANEL
Anion gap: 8 (ref 5–15)
BUN: 11 mg/dL (ref 8–23)
CO2: 27 mmol/L (ref 22–32)
Calcium: 8.7 mg/dL — ABNORMAL LOW (ref 8.9–10.3)
Chloride: 95 mmol/L — ABNORMAL LOW (ref 98–111)
Creatinine, Ser: 0.65 mg/dL (ref 0.44–1.00)
GFR, Estimated: >60 mL/min (ref >60)
Glucose, Bld: 89 mg/dL (ref 70–99)
Potassium: 4.4 mmol/L (ref 3.5–5.1)
Sodium: 130 mmol/L — ABNORMAL LOW (ref 135–145)

## 2024-01-20 LAB — BRAIN NATRIURETIC PEPTIDE: B Natriuretic Peptide: 23 pg/mL (ref 0.0–100.0)

## 2024-01-20 LAB — CBC WITH DIFFERENTIAL/PLATELET
Abs Immature Granulocytes: 0.06 10*3/uL (ref 0.00–0.07)
Basophils Absolute: 0.1 10*3/uL (ref 0.0–0.1)
Basophils Relative: 1 %
Eosinophils Absolute: 0.2 10*3/uL (ref 0.0–0.5)
Eosinophils Relative: 1 %
HCT: 45.9 % (ref 36.0–46.0)
Hemoglobin: 15.9 g/dL — ABNORMAL HIGH (ref 12.0–15.0)
Immature Granulocytes: 1 %
Lymphocytes Relative: 20 %
Lymphs Abs: 2.6 10*3/uL (ref 0.7–4.0)
MCH: 32.4 pg (ref 26.0–34.0)
MCHC: 34.6 g/dL (ref 30.0–36.0)
MCV: 93.7 fL (ref 80.0–100.0)
Monocytes Absolute: 0.9 10*3/uL (ref 0.1–1.0)
Monocytes Relative: 7 %
Neutro Abs: 9 10*3/uL — ABNORMAL HIGH (ref 1.7–7.7)
Neutrophils Relative %: 70 %
Platelets: 239 10*3/uL (ref 150–400)
RBC: 4.9 MIL/uL (ref 3.87–5.11)
RDW: 12.5 % (ref 11.5–15.5)
WBC: 12.7 10*3/uL — ABNORMAL HIGH (ref 4.0–10.5)
nRBC: 0 % (ref 0.0–0.2)

## 2024-01-20 LAB — RESP PANEL BY RT-PCR (RSV, FLU A&B, COVID)  RVPGX2
Influenza A by PCR: NEGATIVE
Influenza B by PCR: NEGATIVE
Resp Syncytial Virus by PCR: NEGATIVE
SARS Coronavirus 2 by RT PCR: NEGATIVE

## 2024-01-20 MED ORDER — SODIUM CHLORIDE 0.9 % IV BOLUS
1000.0000 mL | Freq: Once | INTRAVENOUS | Status: AC
  Administered 2024-01-20: 1000 mL via INTRAVENOUS

## 2024-01-20 MED ORDER — IPRATROPIUM-ALBUTEROL 0.5-2.5 (3) MG/3ML IN SOLN
3.0000 mL | Freq: Once | RESPIRATORY_TRACT | Status: AC
  Administered 2024-01-20: 3 mL via RESPIRATORY_TRACT
  Filled 2024-01-20: qty 3

## 2024-01-20 MED ORDER — ALBUTEROL SULFATE HFA 108 (90 BASE) MCG/ACT IN AERS: 2.0000 | INHALATION_SPRAY | RESPIRATORY_TRACT | Status: DC | PRN

## 2024-01-20 NOTE — ED Triage Notes (Signed)
 Pt arrived via POV from home c/o worsening SOB. Pt reports recently speaking with her PCP and was advised to go to the ER for further evaluation.

## 2024-01-20 NOTE — ED Provider Notes (Signed)
[place] EMERGENCY DEPARTMENT AT [facility] Provider Note   CSN: [id number] Arrival date & time: 01/20/24  1036     History  Chief Complaint  Patient presents with   Shortness of Breath    Cynthia [name] is a 68 y.o. female.  Patient with past history significant for COPD, CAD, smoking history presents emergency department concerns of electrolyte abnormalities and shortness of breath.  She reports that she has COPD at baseline but not requiring oxygen.  Not currently requiring any supplemental O2.  Room air saturation is 94% on arrival.  She states that she had labs drawn by her pulmonologist this week and was advised by her PCP to come to the emergency department for evaluation of electrolyte abnormalities.  She denies any acute symptoms although does endorse some mild confusion.   Shortness of Breath      Home Medications Prior to Admission medications   Medication Sig Start Date End Date Taking? Authorizing Provider  albuterol (PROAIR HFA) 108 (90 Base) MCG/ACT inhaler 2 puffs every 4 hours as needed only  if your can't catch your breath 05/02/23  Yes Nyoka Cowden, MD  aspirin EC 81 MG tablet Take 81 mg by mouth daily.   Yes [provider]  BREZTRI AEROSPHERE 160-9-4.8 MCG/ACT AERO Inhale 2 puffs into the lungs 2 (two) times daily. 02/21/23  Yes [provider]  icosapent Ethyl (VASCEPA) 1 g capsule Take 2 capsules (2 g total) by mouth 2 (two) times daily. 04/25/23  Yes Chilton Si, MD  meloxicam (MOBIC) 7.5 MG tablet Take 1 tablet (7.5 mg total) by mouth daily. 07/04/23  Yes Vickki Hearing, MD  olmesartan (BENICAR) 40 MG tablet Take 40 mg by mouth daily. 02/21/23  Yes [provider]  rosuvastatin (CRESTOR) 10 MG tablet Take 1 tablet (10 mg total) by mouth daily. 05/10/23 01/20/24 Yes Alver Sorrow, NP  spironolactone (ALDACTONE) 25 MG tablet Take 1 tablet (25 mg total) by mouth daily. 11/11/23  Yes Chilton Si, MD       Allergies    Hydrochlorothiazide    Review of Systems   Review of Systems  Respiratory:  Positive for shortness of breath.   All other systems reviewed and are negative.   Physical Exam Updated Vital Signs BP 107/65   Pulse 74   Temp 98.2 F (36.8 C) (Oral)   Resp 17   Ht 5\' 5"  (1.651 m)   Wt 97.9 kg   SpO2 91%   BMI 35.92 kg/m  Physical Exam Vitals and nursing note reviewed.  Constitutional:      General: She is not in acute distress.    Appearance: She is well-developed.  HENT:     Head: Normocephalic and atraumatic.  Eyes:     Conjunctiva/sclera: Conjunctivae normal.  Cardiovascular:     Rate and Rhythm: Normal rate and regular rhythm.     Heart sounds: No murmur heard. Pulmonary:     Effort: Pulmonary effort is normal. No respiratory distress.     Breath sounds: Examination of the right-upper field reveals wheezing. Examination of the left-upper field reveals wheezing. Wheezing present. No decreased breath sounds, rhonchi or rales.  Abdominal:     Palpations: Abdomen is soft.     Tenderness: There is no abdominal tenderness.  Musculoskeletal:        General: No swelling.     Cervical back: Neck supple.     Right lower leg: No edema.     Left lower  leg: No edema.  Skin:    General: Skin is warm and dry.     Capillary Refill: Capillary refill takes less than 2 seconds.  Neurological:     Mental Status: She is alert.  Psychiatric:        Mood and Affect: Mood normal.     ED Results / Procedures / Treatments   Labs (all labs ordered are listed, but only abnormal results are displayed) Labs Reviewed  CBC WITH DIFFERENTIAL/PLATELET - Abnormal; Notable for the following components:      Result Value   WBC 12.7 (*)    Hemoglobin 15.9 (*)    Neutro Abs 9.0 (*)    All other components within normal limits  COMPREHENSIVE METABOLIC PANEL - Abnormal; Notable for the following components:   Sodium 128 (*)    Chloride 91 (*)    Glucose, Bld 106 (*)    AST  50 (*)    All other components within normal limits  BASIC METABOLIC PANEL - Abnormal; Notable for the following components:   Sodium 130 (*)    Chloride 95 (*)    Calcium 8.7 (*)    All other components within normal limits  RESP PANEL BY RT-PCR (RSV, FLU A&B, COVID)  RVPGX2  BRAIN NATRIURETIC PEPTIDE    EKG None  Radiology DG Chest 2 View Result Date: 01/20/2024 CLINICAL DATA:  Short of breath, COPD EXAM: CHEST - 2 VIEW COMPARISON:  01/16/2024 FINDINGS: Frontal and lateral views of the chest demonstrate a stable cardiac silhouette. No acute airspace disease, effusion, or pneumothorax. Mild upper lobe predominant emphysema. No acute bony abnormalities. IMPRESSION: 1. Emphysema.  No acute intrathoracic process. Electronically Signed   By: Sharlet Salina M.D.   On: 01/20/2024 15:07    Procedures Procedures    Medications Ordered in ED Medications  albuterol (VENTOLIN HFA) 108 (90 Base) MCG/ACT inhaler 2 puff (has no administration in time range)  ipratropium-albuterol (DUONEB) 0.5-2.5 (3) MG/3ML nebulizer solution 3 mL (3 mLs Nebulization Given 01/20/24 1314)  sodium chloride 0.9 % bolus 1,000 mL (0 mLs Intravenous Stopped 01/20/24 1540)    ED Course/ Medical Decision Making/ A&P                                 Medical Decision Making Amount and/or Complexity of Data Reviewed Labs: ordered. Radiology: ordered.  Risk Prescription drug management.   This patient presents to the ED for concern of shortness of breath.  Differential diagnosis includes PE, pneumonia, COPD exacerbation, CHF exacerbation   Lab Tests:  I Ordered, and personally interpreted labs.  The pertinent results include: CBC with mild leukocytosis of 12.7, CMP shows hyponatremia at 128 and hypochloremia at 91, repeat BMP after administration of fluid bolus shows sodium improved to 130 and chloride at the 95, BNP normal at 23, respiratory panel negative for COVID-19, influenza and RSV   Imaging Studies  ordered:  I ordered imaging studies including chest x-ray I independently visualized and interpreted imaging which showed 1. Emphysema.  No acute intrathoracic process. I agree with the radiologist interpretation   Medicines ordered and prescription drug management:  I ordered medication including DuoNeb, fluids for COPD, dehydration Reevaluation of the patient after these medicines showed that the patient improved I have reviewed the patients home medicines and have made adjustments as needed   Problem List / ED Course:  Patient past history significant for COPD, CAD, smoking history presents ED  with concerns of shortness of breath and electrolyte normalities.  She reports that she has COPD at baseline not requiring oxygen.  Not currently here in the ER with any supplemental O2 and satting at 94%.  States that she had labs drawn by pulmonology this week and advised by PCP to come to the ED for evaluation with notable hyponatremia and hypochloremia.  She denies any acute focal or more severe symptoms but does endorse some mild confusion at times.  No acute neurological deficits. Exam is unremarkable.  Patient is alert and oriented x 4.  No acute neurological deficit seen during cranial nerve testing. I suspect patient has mild dehydration secondary to diuretic use.  Will administer fluids and a DuoNeb breathing treatment and reassess shortly.  Will repeat BMP after fluids finished. After ministration of fluids, electrolyte and disturbances have improved.  I do suspect this is likely secondary to mild dehydration.  Patient's work of breathing also improved.  In no acute distress at this time and vitals stable otherwise.  Given reassuring findings, show the patient is stable for outpatient follow-up with PCP and pulmonology.  Discussed return precautions.  Discharged home in stable condition.  Social Determinants of Health:  Tobacco smoking history   Final Clinical Impression(s) / ED  Diagnoses Final diagnoses:  Dehydration  Hyponatremia  Hypochloremia  Chronic obstructive pulmonary disease, unspecified COPD type [facility])    Rx / DC Orders ED Discharge Orders     None         Smitty Knudsen, PA-C 01/20/24 1713    Derwood Kaplan, MD 01/21/24 [phone]

## 2024-01-20 NOTE — Discharge Instructions (Signed)
 You were seen in the ER today for concerns of shortness of breath and electrolyte abnormalities. Your labs did show some decreases in your sodium and chloride levels which were corrected with fluids. I also gave you a breathing treatment to help with your COPD. I would suggest following up with your primary care provider for further evaluation as needed. Return to the ER for any new or worsening symptoms.

## 2024-01-20 NOTE — ED Notes (Signed)
 See triage notes. Pt is not labored while resting. A/o able to finish sentences. States has been getting shob with exertion with "fluttering" in chest x 1 week. Color wnl. Non diaphoretic. Nad at this time

## 2024-01-24 ENCOUNTER — Encounter (HOSPITAL_BASED_OUTPATIENT_CLINIC_OR_DEPARTMENT_OTHER): Payer: Self-pay | Admitting: Family

## 2024-01-24 ENCOUNTER — Ambulatory Visit (INDEPENDENT_AMBULATORY_CARE_PROVIDER_SITE_OTHER): Payer: Medicare Other | Admitting: Family

## 2024-01-24 VITALS — BP 150/86 | HR 81 | Ht 65.0 in | Wt 212.6 lb

## 2024-01-24 DIAGNOSIS — E871 Hypo-osmolality and hyponatremia: Secondary | ICD-10-CM | POA: Diagnosis not present

## 2024-01-24 DIAGNOSIS — Z720 Tobacco use: Secondary | ICD-10-CM

## 2024-01-24 DIAGNOSIS — I10 Essential (primary) hypertension: Secondary | ICD-10-CM | POA: Diagnosis not present

## 2024-01-24 DIAGNOSIS — J449 Chronic obstructive pulmonary disease, unspecified: Secondary | ICD-10-CM

## 2024-01-24 MED ORDER — AMLODIPINE-OLMESARTAN 5-40 MG PO TABS
1.0000 | ORAL_TABLET | Freq: Every day | ORAL | 1 refills | Status: DC
Start: 2024-01-24 — End: 2024-07-15

## 2024-01-24 NOTE — Patient Instructions (Signed)
Medication Instructions:  Your physician has recommended you make the following change in your medication:   Stop: Spirolactone  Stop: Olmesartan    Start: Amlodipine-Olmesartan 5-40mg  daily   Follow-Up: At [facility], you and your health needs are our priority.  As part of our continuing mission to provide you with exceptional heart care, we have created designated Provider Care Teams.  These Care Teams include your primary Cardiologist (physician) and Advanced Practice Providers (APPs -  Physician Assistants and Nurse Practitioners) who all work together to provide you with the care you need, when you need it.  We recommend signing up for the patient portal called "MyChart".  Sign up information is provided on this After Visit Summary.  MyChart is used to connect with patients for Virtual Visits (Telemedicine).  Patients are able to view lab/test results, encounter notes, upcoming appointments, etc.  Non-urgent messages can be sent to your provider as well.   To learn more about what you can do with MyChart, go to [email or web address].    Your next appointment:   Follow up in 2 months with Dr. Duke Salvia or Gillian Shields, NP   Other Instructions To prevent or reduce lower extremity swelling: Eat a low salt diet. Salt makes the body hold onto extra fluid which causes swelling. Sit with legs elevated. For example, in the recliner or on an ottoman.  Wear knee-high compression stockings during the daytime. Ones labeled 15-20 mmHg provide good compression.   Tips to Measure your Blood Pressure Correctly  Check blood pressure once per day at least an hour after your medications. If your blood pressure is consistently more than 130/80 after medication changes, we may consider adding Hydralazine.   Here's what you can do to ensure a correct reading:  Don't drink a caffeinated beverage or smoke during the 30 minutes before the test.  Sit quietly for five minutes before the test  begins.  During the measurement, sit in a chair with your feet on the floor and your arm supported so your elbow is at about heart level.  The inflatable part of the cuff should completely cover at least 80% of your upper arm, and the cuff should be placed on bare skin, not over a shirt.  Don't talk during the measurement.   Blood pressure categories  Blood pressure category SYSTOLIC (upper number)  DIASTOLIC (lower number)  Normal Less than 120 mm Hg and Less than 80 mm Hg  Elevated 120-129 mm Hg and Less than 80 mm Hg  High blood pressure: Stage 1 hypertension 130-139 mm Hg or 80-89 mm Hg  High blood pressure: Stage 2 hypertension 140 mm Hg or higher or 90 mm Hg or higher  Hypertensive crisis (consult your doctor immediately) Higher than 180 mm Hg and/or Higher than 120 mm Hg  Source: American Heart Association and American Stroke Association. For more on getting your blood pressure under control, buy Controlling Your Blood Pressure, a Special Health Report from [facility].   Blood Pressure Log   Date   Time  Blood Pressure  Example: Nov 1 9 AM 124/78

## 2024-01-24 NOTE — Progress Notes (Signed)
Cardiology Office Note:  .   Date:  01/24/2024  ID:  Cynthia [name], DOB 03-16-1956, MRN [id number] PCP: Assunta Found, MD  [facility] Providers Cardiologist:  Chilton Si, MD Cardiology APP:  Alver Sorrow, NP    History of Present Illness: .   Cynthia [name] is a 68 y.o. female with history of moderate CAD, mild ascending aortic aneurysm, hypertension, hyperlipidemia, COPD, tobacco use, obesity, sciatica, osteoarthritis, adrenal insufficiency, tobacco use.  CT chest 04/09/2023 with extensive chronic calcifications, aortic atherosclerosis, fusiform dilation ascending aorta 4.1 cm.  Emphysema, hepatic steatosis, small benign pulmonary nodules also noted.  She underwent cardiac CTA 04/2023 calcium score of 2252 placing her in the 99th percentile with moderate stenosis in the RCA, mild stenosis of LAD.  No significant disease by FFR.  Fenofibrate switched to Vascepa due to concern for her gallbladder.    Last seen 11/11/2023 noting lower extremity edema.  Subsequent echocardiogram 11/19/2023 normal LVEF 60-65%, gr1dd, no significant valvular abnormalities.  She was started on spironolactone.  Presents today for follow up. Notes since last seen unfortunately her boyfriend of 10 years passed away. She found him deceased in his bed which was understandably traumatic. She does have good support system in her family but notes smoking more which she attributes to stress.  She reports intolerance to diuretics with "messes with my head". ED visit 01/19/23 with hyponatremia in setting of dehydration. Notes feeling better since receiving fluids in the ED. She is no longer taking Spironolactone and politely declines further diuretics. She has not been checking her blood pressure routinely. Reports her lower extremity swelling in unchanged.   ROS: Please see the history of present illness.    All other systems reviewed and are negative.   Studies Reviewed: .        Cardiac Studies &  Procedures   ______________________________________________________________________________________________     ECHOCARDIOGRAM  ECHOCARDIOGRAM COMPLETE 11/19/2023  Narrative ECHOCARDIOGRAM REPORT    Patient Name:   Cynthia [name] Date of Exam: 11/19/2023 Medical Rec #:  [id number]        Height:       65.0 in Accession #:    [id number]       Weight:       216.1 lb Date of Birth:  25-Mar-1956        BSA:          2.044 m Patient Age:    67 years         BP:           148/78 mmHg Patient Gender: F                HR:           75 bpm. Exam Location:  [facility]  Procedure: 2D Echo, Cardiac Doppler and Color Doppler  Indications:    Edema, essential HTN  History:        Patient has prior history of Echocardiogram examinations, most recent 02/23/2018. CAD, COPD, Signs/Symptoms:Dyspnea; Risk Factors:Hypertension and Current Smoker.  Sonographer:    Vern Claude Referring Phys: [id number] TIFFANY [place]  IMPRESSIONS   1. Left ventricular ejection fraction, by estimation, is 60 to 65%. The left ventricle has normal function. Left ventricular endocardial border not optimally defined to evaluate regional wall motion. Left ventricular diastolic parameters are consistent with Grade I diastolic dysfunction (impaired relaxation). 2. Right ventricular systolic function is normal. The right ventricular size is normal. Tricuspid regurgitation signal is inadequate for assessing PA  pressure. 3. The mitral valve is grossly normal. No evidence of mitral valve regurgitation. No evidence of mitral stenosis. 4. The aortic valve was not well visualized. Aortic valve regurgitation is not visualized. No aortic stenosis is present. 5. The inferior vena cava is normal in size with greater than 50% respiratory variability, suggesting right atrial pressure of 3 mmHg. 6. Cannot exclude a small PFO.  Comparison(s): No prior Echocardiogram.  FINDINGS Left Ventricle: Left ventricular ejection fraction, by  estimation, is 60 to 65%. The left ventricle has normal function. Left ventricular endocardial border not optimally defined to evaluate regional wall motion. The left ventricular internal cavity size was normal in size. There is no left ventricular hypertrophy. Left ventricular diastolic parameters are consistent with Grade I diastolic dysfunction (impaired relaxation). Normal left ventricular filling pressure.  Right Ventricle: The right ventricular size is normal. No increase in right ventricular wall thickness. Right ventricular systolic function is normal. Tricuspid regurgitation signal is inadequate for assessing PA pressure.  Left Atrium: Left atrial size was normal in size.  Right Atrium: Right atrial size was normal in size.  Pericardium: There is no evidence of pericardial effusion.  Mitral Valve: The mitral valve is grossly normal. No evidence of mitral valve regurgitation. No evidence of mitral valve stenosis. MV peak gradient, 3.3 mmHg. The mean mitral valve gradient is 1.0 mmHg.  Tricuspid Valve: The tricuspid valve is not well visualized. Tricuspid valve regurgitation is not demonstrated. No evidence of tricuspid stenosis.  Aortic Valve: The aortic valve was not well visualized. Aortic valve regurgitation is not visualized. No aortic stenosis is present. Aortic valve mean gradient measures 4.5 mmHg. Aortic valve peak gradient measures 9.1 mmHg. Aortic valve area, by VTI measures 2.81 cm.  Pulmonic Valve: The pulmonic valve was not well visualized. Pulmonic valve regurgitation is not visualized. No evidence of pulmonic stenosis.  Aorta: The aortic root and ascending aorta are structurally normal, with no evidence of dilitation.  Venous: The inferior vena cava is normal in size with greater than 50% respiratory variability, suggesting right atrial pressure of 3 mmHg.  IAS/Shunts: Cannot exclude a small PFO.   LEFT VENTRICLE PLAX 2D LVIDd:         4.40 cm       Diastology LVIDs:         2.90 cm      LV e' medial:    8.00 cm/s LV PW:         0.80 cm      LV E/e' medial:  8.0 LV IVS:        0.80 cm      LV e' lateral:   10.31 cm/s LVOT diam:     1.90 cm      LV E/e' lateral: 6.2 LV SV:         72 LV SV Index:   35 LVOT Area:     2.84 cm  LV Volumes (MOD) LV vol d, MOD A2C: 94.0 ml LV vol d, MOD A4C: 104.0 ml LV vol s, MOD A2C: 31.7 ml LV vol s, MOD A4C: 40.6 ml LV SV MOD A2C:     62.3 ml LV SV MOD A4C:     104.0 ml LV SV MOD BP:      62.6 ml  RIGHT VENTRICLE            IVC RV Basal diam:  3.10 cm    IVC diam: 1.30 cm RV Mid diam:    2.30 cm RV S prime:  9.90 cm/s TAPSE (M-mode): 2.3 cm  LEFT ATRIUM             Index        RIGHT ATRIUM           Index LA diam:        3.40 cm 1.66 cm/m   RA Area:     10.70 cm LA Vol (A2C):   43.8 ml 21.43 ml/m  RA Volume:   22.00 ml  10.76 ml/m LA Vol (A4C):   32.5 ml 15.90 ml/m LA Biplane Vol: 38.6 ml 18.88 ml/m AORTIC VALVE                     PULMONIC VALVE AV Area (Vmax):    2.39 cm      PV Vmax:       0.80 m/s AV Area (Vmean):   2.44 cm      PV Peak grad:  2.5 mmHg AV Area (VTI):     2.81 cm AV Vmax:           150.50 cm/s AV Vmean:          100.150 cm/s AV VTI:            0.256 m AV Peak Grad:      9.1 mmHg AV Mean Grad:      4.5 mmHg LVOT Vmax:         127.00 cm/s LVOT Vmean:        86.200 cm/s LVOT VTI:          0.253 m LVOT/AV VTI ratio: 0.99  AORTA Ao Root diam: 2.40 cm Ao Asc diam:  3.50 cm  MITRAL VALVE MV Area (PHT): 3.03 cm     SHUNTS MV Area VTI:   4.33 cm     Systemic VTI:  0.25 m MV Peak grad:  3.3 mmHg     Systemic Diam: 1.90 cm MV Mean grad:  1.0 mmHg MV Vmax:       0.90 m/s MV Vmean:      48.4 cm/s MV Decel Time: 250 msec MV E velocity: 63.70 cm/s MV A velocity: 102.00 cm/s MV E/A ratio:  0.62  Vishnu Priya Mallipeddi Electronically signed by Winfield Rast Mallipeddi Signature Date/Time: 11/19/2023/12:24:05 PM    Final      CT SCANS  CT  CORONARY MORPH W/CTA COR W/SCORE 05/08/2023  Addendum 05/13/2023  1:41 AM ADDENDUM REPORT: 05/13/2023 01:39  EXAM: OVER-READ INTERPRETATION  CT CHEST  The following report is an over-read performed by radiologist Dr. Joen [facility] on 05/13/2023. This over-read does not include interpretation of cardiac or coronary anatomy or pathology. The coronary CTA interpretation by the cardiologist is attached.  COMPARISON:  04/09/2023  FINDINGS: Mediastinum: Visualized tracheobronchial tree and esophagus are unremarkable. No pathologic adenopathy.  Lungs: No acute airspace disease, effusion, or pneumothorax.  Abdomen: Hepatic steatosis.  No acute findings.  Musculoskeletal: No acute or destructive bony abnormalities. Reconstructed images demonstrate no additional findings.  IMPRESSION: 1. Unremarkable noncardiac structures within the visualized chest. 2. Incidental hepatic steatosis.   Electronically Signed By: Sharlet Salina M.D. On: 05/13/2023 01:39  Narrative CLINICAL DATA:  This is a 68 year old female with anginal symptoms.  EXAM: Cardiac/Coronary  CTA  TECHNIQUE: The patient was scanned on a [employer].  FINDINGS: A 100 kV prospective scan was triggered in the descending thoracic aorta at 111 HU's. Axial non-contrast 3 mm slices were carried out through the  heart. The data set was analyzed on a dedicated work station and scored using the Agatson method. Gantry rotation speed was 250 msecs and collimation was .6 mm. No beta blockade and 0.8 mg of sl NTG was given. The 3D data set was reconstructed in 5% intervals of the 67-82 % of the R-R cycle. Diastolic phases were analyzed on a dedicated work station using MPR, MIP and VRT modes. The patient received 80 cc of contrast.  Image Quality: Fair due to misregistration artifact.  Aorta: Normal size. Minimal aortic root calcifications. No dissection.  Aortic Valve:  Trileaflet.  No  calcifications.  Coronary Arteries:  Normal coronary origin.  Right dominance.  RCA is a large dominant artery that gives rise to PDA and PLA. Proximal RCA with moderate (50-69%) calcified plaques. The rest of the RCA is diffusely with mixed calcification but unable to quantify due to RCA motion and misregistration artifact.  Left main is a large artery that gives rise to LAD and LCX arteries. Minimal (<24%) calcification in the distal Left main.  LAD is a large vessel. There is a mild (24-49%) in the ostium of the LAD. The proximal to mid LAD with moderate calcified plaque. Minimal soft plaque in the mid to distal LAD.  LCX is a non-dominant artery that gives rise to one large OM1 branch. There is mild calcified plaque in the proximal LCX. The mid LCX with mild soft plaque. The OM1 vessel with a high take off and moderate mixed plaque in the proximal portion of the vessel.  Coronary Calcium Score:  Left main: 0  Left anterior descending artery: 619  Left circumflex artery: 731  Right coronary artery: 901  Total: 2252  Percentile: 99  Other findings:  Normal pulmonary vein drainage into the left atrium.  Normal left atrial appendage without a thrombus.  Normal size of the pulmonary artery.  IMPRESSION: 1. Coronary calcium score of 2252. This was 70 percentile for age and sex matched control.  2. Normal coronary origin with right dominance.  3. CAD-RADS 3. Moderate stenosis. Consider symptom-guided anti-ischemic pharmacotherapy as well as risk factor modification per guideline directed care. Additional analysis with CT FFR will be submitted.  The noncardiac portion of this study will be interpreted in separate report by the radiologist.  Electronically Signed: By: Thomasene Ripple D.O. On: 05/08/2023 14:40     ______________________________________________________________________________________________      Risk Assessment/Calculations:     HYPERTENSION  CONTROL Vitals:   01/24/24 0901 01/24/24 0905  BP: (!) 152/86 (!) 150/86    The patient's blood pressure is elevated above target today.  In order to address the patient's elevated BP: A new medication was prescribed today.; Follow up with general cardiology has been recommended.          Physical Exam:   VS:  BP (!) 150/86   Pulse 81   Ht 5\' 5"  (1.651 m)   Wt 212 lb 9.6 oz (96.4 kg)   SpO2 93%   BMI 35.38 kg/m    Wt Readings from Last 3 Encounters:  01/24/24 212 lb 9.6 oz (96.4 kg)  01/20/24 215 lb 13.3 oz (97.9 kg)  01/16/24 216 lb (98 kg)    GEN: Well nourished, well developed in no acute distress NECK: No JVD; No carotid bruits CARDIAC: RRR, no murmurs, rubs, gallops RESPIRATORY:  Clear to auscultation without rales, wheezing or rhonchi  ABDOMEN: Soft, non-tender, non-distended EXTREMITIES:  No edema; No deformity   ASSESSMENT AND PLAN: .  LE edema/venous stasis- Echo 11/2023 normal LVEF, no significant valvular abnormality. Bilateral LE with nonpitting edema. Likely etiology venous insufficiency.  has not tolerated diuretics with electrolyte abnormalities including hyponatremia.  Encourage leg elevation.    HTN- BP not at goal <130/80. Intolerant of hydrochlorothiazide, Spironoalctone with electrolyte abnormalities. Stop Olmesartan. Start Amlodipine-Olmesartan 5-40mg  daily. Discussed to monitor BP at home at least 2 hours after medications and sitting for 5-10 minutes. Future considerations include Hydralazine 25mg  BID if BP persistently elevated. Hesitant to utilize high dose Amlodipine due to concern for worsened edema.  CAD/HLD, LDL goal less than 70 - Stable with no anginal symptoms. No indication for ischemic evaluation.  GDMT aspirin, rosuvastatin.   Grief - Understandably grieving the loss of her boyfriend. She does have good support system in her brother and sister-in-law. Condolences offered.  Tobacco use / COPD - Smoking cessation encouraged. Recommend  utilization of [id number]. No evidence of acute COPD exacerbation.        Dispo: follow up in 2 months  Signed, Alver Sorrow, NP

## 2024-02-03 ENCOUNTER — Telehealth: Payer: Self-pay | Admitting: Internal Medicine

## 2024-02-03 NOTE — Telephone Encounter (Signed)
 Spoke with patient regarding new appointment date and time 03/06/24 at 8:45 am----patient voiced her understanding and I will mail information to patient

## 2024-02-03 NOTE — Progress Notes (Deleted)
 Cynthia [name], female    DOB: 1956/04/10    MRN: [id number]   Brief patient profile:  74  yowf  active smoker  referred to pulmonary clinic in [place]  05/02/2023 by Cynthia [name]  for recurrent pna x 2019 and new cough x  March 2024      History of Present Illness  05/02/2023  Pulmonary/ 1st office eval/ Cynthia [name] / [name]  on [place]  Chief Complaint  Patient presents with   Consult    Pt consult referred by PCP for COPD evaluation, she currently has a productive cuogh w/ yellow sputum. Smokes 1-2ppd and is trying to quit. She reports that her O2 sats had been low but have since improved  Dyspnea:  limited by R hip pain / walks half mile around [place] x 30 min plus stationery bike has to stop at of hills  Cough: first thing in am yellow/ thick no recent abx  x 30 min  Sleep: bed is flat/ lots of pillows  SABA use: not using hfa much/ uses neb more  02: none  Gen bilateral lower chest and abd pain just when coughing  Rec The key is to stop smoking completely before smoking completely stops you!  Augmentin 875 mg take one pill twice daily  X 10 days  Cough  >  Mucinex dm 1200 mg twice daily as needed  Plan A = Automatic = Always=    Breztri Take 2 puffs first thing in am and then another 2 puffs about 12 hours later.  Work on inhaler technique:   Plan B = Backup (to supplement plan A, not to replace it) Only use your albuterol inhaler as a rescue medication Plan C = Crisis (instead of Plan B but only if Plan B stops working) - only use your albuterol nebulizer if you first try Plan B and it fails to help > ok to use the nebulizer up to every 4 hours but if start needing it regularly call for immediate appointment Pantoprazole (protonix) 40 mg   Take  30-60 min before first meal of the day and Pepcid (famotidine)  20 mg after supper until return to office GERD diet reviewed, bed blocks rec    Please remember to go to the lab department  allergy screen Eos 0.1   alpha  one AT phenotype  MM  level 159      06/19/2023  f/u ov/[place] office/Cynthia [name] re: GOLD 2/ AB maint on breztri   Chief Complaint  Patient presents with   Follow-up    Cough is some better- still occ yellow sputum. Breathing is overall doing well and has not needed her albuterol.   Dyspnea:  limited by R hip and no better with injections/ some better with nsaids  Cough: mostly in am x 1 hour, yellow Sleeping: flat bed / bunch of pillows SABA use: not using either  02: none  Lung cancer screening: 03/3023 reviewed Rec My office will be contacting you by phone for referral to orthopedics and sinus CT   Work on inhaler technique:   >>>  Remember how golfers warm up by taking practice swings - do this with an empty inhaler  The key is to stop smoking completely before smoking completely stops you!       01/16/2024  49m f/u ov/[place] office/Cynthia [name] re: GOLD 2 copd /AB maint on no maint rx - thinks albuterol works better than anything  Chief Complaint  Patient presents with   Follow-up  6 month follow up   Dyspnea:  mb and back stops half way / 75 ft slt uphill  Cough: better off breztri  Sleeping: flat bed /pillows no resp cc  SABA use: hfa up to 4 x daily  02: none  Lung cancer screening: should be due in April 2025  Rec Depomedrol 120 mg IM  Only use your albuterol as a rescue medication  Also  Ok to try albuterol 15 min before an activity (on alternating days)  that you know would usually make you short of breath  Use your nebulizer up to every 4 hours if inhaler isn't working working for you and if this doesn't relieve your breathing difficulty then go to ER  Please remember to go to the lab department   for your tests - we will call you with the results when they are available. Please remember to go to the  x-ray department  @  [facility] for your tests - we will call you with the results when they are available     Please schedule a follow up office visit in 2  weeks, sooner if needed  with all medications /inhalers/ solutions in hand     02/06/2024  f/u ov/[place] office/Cynthia [name] re: *** maint on ***  No chief complaint on file.   Dyspnea:  *** Cough: *** Sleeping: ***   resp cc  SABA use: *** 02: ***  Lung cancer screening: ***   No obvious day to day or daytime variability or assoc excess/ purulent sputum or mucus plugs or hemoptysis or cp or chest tightness, subjective wheeze or overt sinus or hb symptoms.    Also denies any obvious fluctuation of symptoms with weather or environmental changes or other aggravating or alleviating factors except as outlined above   No unusual exposure hx or h/o childhood pna/ asthma or knowledge of premature birth.  Current Allergies, Complete Past Medical History, Past Surgical History, Family History, and Social History were reviewed in [employer] record.  ROS  The following are not active complaints unless bolded Hoarseness, sore throat, dysphagia, dental problems, itching, sneezing,  nasal congestion or discharge of excess mucus or purulent secretions, ear ache,   fever, chills, sweats, unintended wt loss or wt gain, classically pleuritic or exertional cp,  orthopnea pnd or arm/hand swelling  or leg swelling, presyncope, palpitations, abdominal pain, anorexia, nausea, vomiting, diarrhea  or change in bowel habits or change in bladder habits, change in stools or change in urine, dysuria, hematuria,  rash, arthralgias, visual complaints, headache, numbness, weakness or ataxia or problems with walking or coordination,  change in mood or  memory.        No outpatient medications have been marked as taking for the 02/06/24 encounter (Appointment) with Nyoka Cowden, MD.              Past Medical History:  Diagnosis Date   Ascending aortic aneurysm (HCC) 04/25/2023   CAD in native artery 04/25/2023   Elevated liver enzymes    Elevated triglycerides with high cholesterol     Hypertension    Hypertriglyceridemia 04/25/2023   S/P tooth extraction 01/2013   for dentures   Tobacco use         Objective:    Wts  02/06/2024        ***  01/16/2024          216  06/19/2023       199  05/02/23 202 lb 9.6 oz (  91.9 kg)  04/25/23 206 lb 4.8 oz (93.6 kg)  03/25/23 215 lb (97.5 kg)    Vital signs reviewed  02/06/2024  - Note at rest 02 sats  ***% on ***   General appearance:    ***   Min barr  rubror both LE's knees down but no pitting(always better in am)   ***          CXR PA and Lateral:   01/16/2024 :    I personally reviewed images and impression is as follows:     Increase bronchial markings/ no acute findings     Assessment

## 2024-02-03 NOTE — Telephone Encounter (Signed)
 LVM for patient to call and discuss rescheduling the 02/06/24 11:30 am appointment with Dr. Wert----Dr. Sherene Sires has to be out of the office----will send My Chart message as well

## 2024-02-06 ENCOUNTER — Ambulatory Visit: Payer: Medicare Other | Admitting: Internal Medicine

## 2024-03-01 NOTE — Progress Notes (Signed)
 Cynthia [name], female    DOB: 18-Apr-1956    MRN: [id number]   Brief patient profile:  27 yowf  active smoker/MM   referred to pulmonary clinic in [place]  05/02/2023 by Jeannetta Ellis  for recurrent pna x 2019 and new cough x  March 2024.     History of Present Illness  05/02/2023  Pulmonary/ 1st office eval/ Santoria Chason / [facility]  on [facility]  Chief Complaint  Patient presents with   Consult    Pt consult referred by PCP for COPD evaluation, she currently has a productive cuogh w/ yellow sputum. Smokes 1-2ppd and is trying to quit. She reports that her O2 sats had been low but have since improved  Dyspnea:  limited by R hip pain / walks half mile around [place] x 30 min plus station[employer] bike has to stop at of hills  Cough: first thing in am yellow/ thick no recent abx  x 30 min  Sleep: bed is flat/ lots of pillows  SABA use: not using hfa much/ uses neb more  02: none  Gen bilateral lower chest and abd pain just when coughing  Rec The key is to stop smoking completely before smoking completely stops you!  Augmentin 875 mg take one pill twice daily  X 10 days  Cough  >  Mucinex dm 1200 mg twice daily as needed  Plan A = Automatic = Always=    Breztri Take 2 puffs first thing in am and then another 2 puffs about 12 hours later.  Work on inhaler technique:   Plan B = Backup (to supplement plan A, not to replace it) Only use your albuterol inhaler as a rescue medication Plan C = Crisis (instead of Plan B but only if Plan B stops working) - only use your albuterol nebulizer if you first try Plan B and it fails to help > ok to use the nebulizer up to every 4 hours but if start needing it regularly call for immediate appointment Pantoprazole (protonix) 40 mg   Take  30-60 min before first meal of the day and Pepcid (famotidine)  20 mg after supper until return to office GERD diet reviewed, bed blocks rec     05/02/23:  allergy screen Eos 0.1   alpha one AT phenotype  MM  level 159      01/16/2024  63m f/u ov/[place] office/Ruford Dudzinski re: GOLD 2 copd /AB maint on no maint rx - thinks albuterol works better than anything  Chief Complaint  Patient presents with   Follow-up    6 month follow up   Dyspnea:  mb and back stops half way / 75 ft slt uphill  Cough: better off breztri  Sleeping: flat bed /pillows no resp cc  SABA use: hfa up to 4 x daily  02: none  Lung cancer screening: should be due in April 2025  Rec Depomedrol 120 mg IM  Only use your albuterol as a rescue medication  Also  Ok to try albuterol 15 min before an activity (on alternating days)  that you know would usually make you short of breath  Use your nebulizer up to every 4 hours if inhaler isn't working working for you and if this doesn't relieve your breathing difficulty then go to ER    Please schedule a follow up office visit in 2 weeks, sooner if needed  with all medications /inhalers/ solutions in hand    03/06/2024  f/u ov/[place] office/Jarae Nemmers re: GOLD 2 copd/  AB  did  bring meds - no maint rx  Chief Complaint  Patient presents with   Follow-up    2 week follow up for COPD she said that she is feeling better   Dyspnea:  work in yard all day, does better s breztri "cause makes her cough" Cough: none now  Sleeping: flat bed 2 pillows no resp cc  SABA use: none at present  02: none   Lung cancer screening: referred today    No obvious day to day or daytime variability or assoc excess/ purulent sputum or mucus plugs or hemoptysis or cp or chest tightness, subjective wheeze or overt sinus or hb symptoms.    Also denies any obvious fluctuation of symptoms with weather or environmental changes or other aggravating or alleviating factors except as outlined above   No unusual exposure hx or h/o childhood pna/ asthma or knowledge of premature birth.  Current Allergies, Complete Past Medical History, Past Surgical History, Family History, and Social History were reviewed in [employer] record.  ROS  The following are not active complaints unless bolded Hoarseness, sore throat, dysphagia, dental problems, itching, sneezing,  nasal congestion or discharge of excess mucus or purulent secretions, ear ache,   fever, chills, sweats, unintended wt loss or wt gain, classically pleuritic or exertional cp,  orthopnea pnd or arm/hand swelling  or leg swelling, presyncope, palpitations, abdominal pain, anorexia, nausea, vomiting, diarrhea  or change in bowel habits or change in bladder habits, change in stools or change in urine, dysuria, hematuria,  rash, arthralgias, visual complaints, headache, numbness, weakness or ataxia or problems with walking or coordination,  change in mood or  memory.        Current Meds  Medication Sig   albuterol (PROAIR HFA) 108 (90 Base) MCG/ACT inhaler 2 puffs every 4 hours as needed only  if your can't catch your breath   amLODipine-olmesartan (AZOR) 5-40 MG tablet Take 1 tablet by mouth daily.   aspirin EC 81 MG tablet Take 81 mg by mouth daily.   Cholecalciferol (VITAMIN D3) 50 MCG (2000 UT) capsule Take 2,000 Units by mouth daily.   icosapent Ethyl (VASCEPA) 1 g capsule Take 2 capsules (2 g total) by mouth 2 (two) times daily.   meloxicam (MOBIC) 7.5 MG tablet Take 1 tablet (7.5 mg total) by mouth daily.   rosuvastatin (CRESTOR) 10 MG tablet Take 10 mg by mouth daily.   [DISCONTINUED] BREZTRI AEROSPHERE 160-9-4.8 MCG/ACT AERO Inhale 2 puffs into the lungs 2 (two) times daily.              Past Medical History:  Diagnosis Date   Ascending aortic aneurysm (HCC) 04/25/2023   CAD in native artery 04/25/2023   Elevated liver enzymes    Elevated triglycerides with high cholesterol    Hypertension    Hypertriglyceridemia 04/25/2023   S/P tooth extraction 01/2013   for dentures   Tobacco use         Objective:    Wts  03/06/2024        206  01/16/2024          216  06/19/2023       199  05/02/23 202 lb 9.6 oz (91.9 kg)   04/25/23 206 lb 4.8 oz (93.6 kg)  03/25/23 215 lb (97.5 kg)    Vital signs reviewed  03/06/2024  - Note at rest 02 sats  91% on RA   General appearance:    amb wf nad  HEENT : Oropharynx  clear   Nasal turbinates nl    NECK :  without  apparent JVD/ palpable Nodes/TM    LUNGS: no acc muscle use,  Min barrel  contour chest wall with mild  bilateral insp/exp rhonchi and  without cough on insp or exp maneuvers and min  Hyperresonant  to  percussion bilaterally    CV:  RRR  no s3 or murmur or increase in P2, and no edema   ABD:  soft and nontender   MS:  Nl gait/ ext warm without deformities Or obvious joint restrictions  calf tenderness, cyanosis or clubbing     SKIN: warm and dry without lesions    NEURO:  alert, approp, nl sensorium with  no motor or cerebellar deficits apparent.                CXR PA and Lateral:   01/16/2024 :    I personally reviewed images and impression is as follows:     Increase bronchial markings/ no acute findings     Assessment

## 2024-03-02 DIAGNOSIS — J449 Chronic obstructive pulmonary disease, unspecified: Secondary | ICD-10-CM | POA: Diagnosis not present

## 2024-03-02 DIAGNOSIS — E559 Vitamin D deficiency, unspecified: Secondary | ICD-10-CM | POA: Diagnosis not present

## 2024-03-02 DIAGNOSIS — I10 Essential (primary) hypertension: Secondary | ICD-10-CM | POA: Diagnosis not present

## 2024-03-02 DIAGNOSIS — E785 Hyperlipidemia, unspecified: Secondary | ICD-10-CM | POA: Diagnosis not present

## 2024-03-02 DIAGNOSIS — F17201 Nicotine dependence, unspecified, in remission: Secondary | ICD-10-CM | POA: Diagnosis not present

## 2024-03-02 DIAGNOSIS — R7309 Other abnormal glucose: Secondary | ICD-10-CM | POA: Diagnosis not present

## 2024-03-02 DIAGNOSIS — Z0001 Encounter for general adult medical examination with abnormal findings: Secondary | ICD-10-CM | POA: Diagnosis not present

## 2024-03-02 DIAGNOSIS — E871 Hypo-osmolality and hyponatremia: Secondary | ICD-10-CM | POA: Diagnosis not present

## 2024-03-02 DIAGNOSIS — R748 Abnormal levels of other serum enzymes: Secondary | ICD-10-CM | POA: Diagnosis not present

## 2024-03-06 ENCOUNTER — Telehealth: Payer: Self-pay | Admitting: Internal Medicine

## 2024-03-06 ENCOUNTER — Ambulatory Visit (INDEPENDENT_AMBULATORY_CARE_PROVIDER_SITE_OTHER): Payer: Medicare Other | Admitting: Internal Medicine

## 2024-03-06 ENCOUNTER — Encounter: Payer: Self-pay | Admitting: Internal Medicine

## 2024-03-06 VITALS — BP 131/72 | HR 85 | Ht 65.0 in | Wt 206.6 lb

## 2024-03-06 DIAGNOSIS — J449 Chronic obstructive pulmonary disease, unspecified: Secondary | ICD-10-CM

## 2024-03-06 DIAGNOSIS — F17210 Nicotine dependence, cigarettes, uncomplicated: Secondary | ICD-10-CM | POA: Diagnosis not present

## 2024-03-06 MED ORDER — BUDESONIDE 0.25 MG/2ML IN SUSP
RESPIRATORY_TRACT | 12 refills | Status: AC
Start: 2024-03-06 — End: ?

## 2024-03-06 MED ORDER — ALBUTEROL SULFATE (2.5 MG/3ML) 0.083% IN NEBU: 2.5000 mg | INHALATION_SOLUTION | RESPIRATORY_TRACT | 12 refills | Status: AC | PRN

## 2024-03-06 NOTE — Telephone Encounter (Signed)
 Patient did not stop at check out 03/06/24 and schedule the 3 month follow up requested by Dr. Ebony Hail requesting patient call an schedule---will enter call

## 2024-03-06 NOTE — Assessment & Plan Note (Addendum)
4-5 min discussion re active cigarette smoking in addition to office E&M  Ask about tobacco use:   ongoing Advise quitting   I took an extended  opportunity with this patient to outline the consequences of continued cigarette use  in airway disorders based on all the data we have from the multiple national lung health studies (perfomed over decades at millions of dollars in cost)  indicating that smoking cessation, not choice of inhalers or pulmonary physicians, is the most important aspect of her care.   Assess willingness:  Not committed at this point Assist in quit attempt:  Per PCP when ready Arrange follow up:   Follow up per Primary Care planned     Low-dose CT lung cancer screening is recommended for patients who are 50-80 years of age with a 20+ pack-year history of smoking and who are currently smoking or quit <=15 years ago. No coughing up blood  No unintentional weight loss of > 15 pounds in the last 6 months - pt is eligible for scanning yearly until age 80 > referred      

## 2024-03-06 NOTE — Patient Instructions (Addendum)
 Plan A = Automatic = Always=    Breathe clean air  Plan B = Backup (to supplement plan A, not to replace it) Only use your albuterol inhaler as a rescue medication to be used if you can't catch your breath by resting or doing a relaxed purse lip breathing pattern.  - The less you use it, the better it will work when you need it. - Ok to use the inhaler up to 2 puffs  every 4 hours if you must but call for appointment if use goes up over your usual need - Don't leave home without it !!  (think of it like the spare tire for your car)   Work on inhaler technique:  relax and gently blow all the way out then take a nice smooth full deep breath back in, triggering the inhaler at same time you start breathing in.  Hold breath in for at least  5 seconds if you can.       Plan C = Crisis (instead of Plan B but only if Plan B stops working) - only use your albuterol/budesonide 0.25  nebulizer if you first try Plan B and it fails to help > ok to use the nebulizer up to every 4 hours but if start needing it regularly call for immediate appointment   Plan D = Doctor - call me or Dr Phillips Odor  if B and C not adequate  My office will be contacting you by phone for referral to lung cancer screening   [phone]) - if you don't hear back from my office within one week,  please call us back or notify us thru MyChart and we'll address it right away.    Please schedule a follow up visit in 3 months but call sooner if needed

## 2024-03-07 NOTE — Assessment & Plan Note (Addendum)
 Active smoker/MM - emphysema on CT 04/09/23  - Labs ordered 05/02/2023  :  allergy screen Eos 0.1   alpha one AT phenotype  MM  level 159  - 05/02/2023  After extensive coaching inhaler device,  effectiveness =    60% (short ti) > continue breztri/ rx rhinitis/ sinusitis with augmentin and empirical gerd x 6 weeks then regroup - PFT's  05/31/23  FEV1 1.54 (54 % ) ratio 0.66  p 0 % improvement from saba p no Breztri prior to study with DLCO  13.37 (66%)   and FV curve mild but classically concave  and ERV 51% at wt 197   - 06/19/2023  After extensive coaching inhaler device,  effectiveness =   50% > continue breztri  - 01/16/2024 pt d/c'd breztri felt better off it due to cough  - 03/06/2024  After extensive coaching inhaler device,  effectiveness =    25% hfa   She is acting more like GROUP A COPD in terms of symptom / risk and cough which is one of her main complaints, is better now that off the powerful maint rx anyway so rec try just using hfa saba prn an if use goes up then add budesonide to alb neb (the equivalent of air supra in a nebulizer)  if can't master HFA better (DPI is likely to make cough even worse)  but if does master HFA > low dose symbiocrt prn would be my preference.  F/u in 3 m, sooner if needed  Each maintenance medication was reviewed in detail including emphasizing most importantly the difference between maintenance and prns and under what circumstances the prns are to be triggered using an action plan format where appropriate.  Total time for H and P, chart review, counseling, reviewing hfa  device(s) and generating customized AVS unique to this office visit / same day charting = 24 min

## 2024-03-23 ENCOUNTER — Encounter (HOSPITAL_BASED_OUTPATIENT_CLINIC_OR_DEPARTMENT_OTHER): Payer: Self-pay | Admitting: Family

## 2024-03-23 ENCOUNTER — Other Ambulatory Visit (HOSPITAL_COMMUNITY): Payer: Self-pay | Admitting: Family Medicine

## 2024-03-23 ENCOUNTER — Ambulatory Visit (HOSPITAL_BASED_OUTPATIENT_CLINIC_OR_DEPARTMENT_OTHER): Payer: Medicare Other | Admitting: Family

## 2024-03-23 VITALS — BP 120/70 | HR 80 | Ht 66.0 in | Wt 205.0 lb

## 2024-03-23 DIAGNOSIS — I10 Essential (primary) hypertension: Secondary | ICD-10-CM

## 2024-03-23 DIAGNOSIS — I25118 Atherosclerotic heart disease of native coronary artery with other forms of angina pectoris: Secondary | ICD-10-CM

## 2024-03-23 DIAGNOSIS — E785 Hyperlipidemia, unspecified: Secondary | ICD-10-CM

## 2024-03-23 DIAGNOSIS — Z1231 Encounter for screening mammogram for malignant neoplasm of breast: Secondary | ICD-10-CM

## 2024-03-23 DIAGNOSIS — J449 Chronic obstructive pulmonary disease, unspecified: Secondary | ICD-10-CM | POA: Diagnosis not present

## 2024-03-23 NOTE — Progress Notes (Signed)
 Cardiology Office Note:  .   Date:  03/23/2024  ID:  Cynthia [name], DOB 06-Apr-1956, MRN [id number] PCP: Cynthia Found, MD  [facility] Providers Cardiologist:  Cynthia Si, MD Cardiology APP:  Cynthia Sorrow, NP    History of Present Illness: .   Cynthia [name] is a 68 y.o. female with history of moderate CAD, mild ascending aortic aneurysm, hypertension, hyperlipidemia, COPD, tobacco use, obesity, sciatica, osteoarthritis, adrenal insufficiency, tobacco use.  CT chest 04/09/2023 with extensive chronic calcifications, aortic atherosclerosis, fusiform dilation ascending aorta 4.1 cm.  Emphysema, hepatic steatosis, small benign pulmonary nodules also noted.  She underwent cardiac CTA 04/2023 calcium score of 2252 placing her in the 99th percentile with moderate stenosis in the RCA, mild stenosis of LAD.  No significant disease by FFR.  Fenofibrate switched to Vascepa due to concern for her gallbladder.    Last seen 11/11/2023 noting lower extremity edema.  Subsequent echocardiogram 11/19/2023 normal LVEF 60-65%, gr1dd, no significant valvular abnormalities.  She was started on spironolactone.  ED visit 01/19/23 with hyponatremia in setting of dehydration. At visit 01/24/2024 olmesartan stopped and she was started on amlodipine-olmesartan 5-40 mg daily.  Spironolactone discontinued due to persistent electrolyte abnormalities.  She was understandably grieving the loss of her significant other of 10 years.  Presents today for follow-up.  Reports having labs since last seen with her primary care provider, not available for review, and that her blood sugar was high and encouraged to make lifestyle changes prior to medical therapy.  She also noted her sodium had normalized since being off of diuretics.  Blood pressure checked intermittently at home with no concerning readings.  She continues to smoke 1 pack/day and is hopeful to decrease in the future but understandably not quite  ready.  ROS: Please see the history of present illness.    All other systems reviewed and are negative.   Studies Reviewed: .           Risk Assessment/Calculations:             Physical Exam:   VS:  BP 120/70 (BP Location: Right Arm, Patient Position: Sitting, Cuff Size: Normal)   Pulse 80   Ht 5\' 6"  (1.676 m)   Wt 205 lb (93 kg)   SpO2 95%   BMI 33.09 kg/m    Wt Readings from Last 3 Encounters:  03/23/24 205 lb (93 kg)  03/06/24 206 lb 9.6 oz (93.7 kg)  01/24/24 212 lb 9.6 oz (96.4 kg)    GEN: Well nourished, well developed in no acute distress NECK: No JVD; No carotid bruits CARDIAC: RRR, no murmurs, rubs, gallops RESPIRATORY:  Clear to auscultation without rales, wheezing or rhonchi  ABDOMEN: Soft, non-tender, non-distended EXTREMITIES:  No edema; No deformity   ASSESSMENT AND PLAN: .    LE edema/venous stasis- Echo 11/2023 normal LVEF, no significant valvular abnormality. Bilateral LE with nonpitting edema. Likely etiology venous insufficiency.  Has not tolerated diuretics with electrolyte abnormalities including hyponatremia.  Encourage leg elevation, low-sodium diet  HTN- BP at goal <130/80. Intolerant of hydrochlorothiazide, Spironoalctone with electrolyte abnormalities.  Continue Amlodipine-Olmesartan 5-40mg  daily. Discussed to monitor BP at home at least 2 hours after medications and sitting for 5-10 minutes. Future considerations if needed include Hydralazine 25mg  BID if BP persistently elevated. Hesitant to utilize high dose Amlodipine due to concern for worsened edema.  CAD/HLD, LDL goal less than 70 - Stable with no anginal symptoms. No indication for ischemic evaluation.  GDMT aspirin  81 mg daily, rosuvastatin 10 mg daily.   Tobacco use / COPD - Smoking cessation encouraged.  Presently smoking 1 PPD.  Recommend utilization of 1800QUITNOW. No evidence of acute COPD exacerbation.   Prediabetes - Encouraged to continue exercise by working in her yard.  We  discussed ways to reduce intake of carbohydrates such as adding fiber or choosing whole-wheat options.  Offered referral to nutrition and diabetic educator.  She will contact us  if interested.       Dispo: follow up in 6 months  Signed, Cynthia Curia, NP

## 2024-03-23 NOTE — Patient Instructions (Signed)
 Medication Instructions:  Your physician recommends that you continue on your current medications as directed. Please refer to the Current Medication list given to  Follow-Up: At [facility], you and your health needs are our priority.  As part of our continuing mission to provide you with exceptional heart care, our providers are all part of one team.  This team includes your primary Cardiologist (physician) and Advanced Practice Providers or APPs (Physician Assistants and Nurse Practitioners) who all work together to provide you with the care you need, when you need it.  Your next appointment:   6 months with Dr. Theodis Fiscal, Neomi Banks, NP or Slater Duncan, NP   We recommend signing up for the patient portal called "MyChart".  Sign up information is provided on this After Visit Summary.  MyChart is used to connect with patients for Virtual Visits (Telemedicine).  Patients are able to view lab/test results, encounter notes, upcoming appointments, etc.  Non-urgent messages can be sent to your provider as well.   To learn more about what you can do with MyChart, go to [email or web address].   Other Instructions Please let us  know if you would like to see Nutrition

## 2024-04-10 ENCOUNTER — Telehealth: Payer: Self-pay

## 2024-04-10 DIAGNOSIS — Z87891 Personal history of nicotine dependence: Secondary | ICD-10-CM

## 2024-04-10 DIAGNOSIS — Z122 Encounter for screening for malignant neoplasm of respiratory organs: Secondary | ICD-10-CM

## 2024-04-10 DIAGNOSIS — F17210 Nicotine dependence, cigarettes, uncomplicated: Secondary | ICD-10-CM

## 2024-04-10 NOTE — Telephone Encounter (Signed)
.  Lung Cancer Screening Narrative/Criteria Questionnaire (Cigarette Smokers Only- No Cigars/Pipes/vapes)   Cynthia [name]   SDMV:04/24/2024 at 9:00 am with Mathis Som       01-08-1956   LDCT: 04/27/2024 at 7:00 am at [facility]    68 y.o.   Phone: [phone]  Lung Screening Narrative (confirm age 5-77 yrs Medicare / 50-80 yrs Private pay insurance)   Insurance information: UHC/BCBS   Referring Provider: Waymond Hailey   This screening involves an initial phone call with a team member from our program. It is called a shared decision making visit. The initial meeting is required by  insurance and Medicare to make sure you understand the program. This appointment takes about 15-20 minutes to complete. You will complete the screening scan at your scheduled date/time.  This scan takes about 5-10 minutes to complete. You can eat and drink normally before and after the scan.  Criteria questions for Lung Cancer Screening:   Are you a current or former smoker? Current Age began smoking: 55   If you are a former smoker, what year did you quit smoking? (within 15 yrs)   To calculate your smoking history, I need an accurate estimate of how many packs of cigarettes you smoked per day and for how many years. (Not just the number of PPD you are now smoking)   Years smoking 41 x Packs per day 2 = Pack years 82   (at least 20 pack yrs)   (Make sure they understand that we need to know how much they have smoked in the past, not just the number of PPD they are smoking now)  Do you have a personal history of cancer?  No    Do you have a family history of cancer? No  Are you coughing up blood?  No  Have you had unexplained weight loss of 15 lbs or more in the last 6 months? No  It looks like you meet all criteria.  When would be a good time for us  to schedule you for this screening?   Additional information:

## 2024-04-24 ENCOUNTER — Encounter: Payer: Self-pay | Admitting: *Deleted

## 2024-04-24 ENCOUNTER — Ambulatory Visit: Admitting: *Deleted

## 2024-04-24 DIAGNOSIS — F17210 Nicotine dependence, cigarettes, uncomplicated: Secondary | ICD-10-CM | POA: Diagnosis not present

## 2024-04-24 NOTE — Progress Notes (Signed)
 Virtual Visit via Telephone Note  I connected with Ethan Henri on 04/24/24 at  9:00 AM EDT by telephone and verified that I am speaking with the correct person using two identifiers.  Location: Patient: Cynthia [name] Provider: Alyse Bach, RN   I discussed the limitations, risks, security and privacy concerns of performing an evaluation and management service by telephone and the availability of in person appointments. I also discussed with the patient that there may be a patient responsible charge related to this service. The patient expressed understanding and agreed to proceed.    Shared Decision Making Visit Lung Cancer Screening Program [phone])   Eligibility: Age 68 y.o. Pack Years Smoking History Calculation 58 (# packs/per year x # years smoked) Recent History of coughing up blood  no Unexplained weight loss? no ( >Than 15 pounds within the last 6 months ) Prior History Lung / other cancer no (Diagnosis within the last 5 years already requiring surveillance chest CT Scans). Smoking Status Current Smoker Former Smokers: Years since quit: n/a  Quit Date: n/a  Visit Components: Discussion included one or more decision making aids. yes Discussion included risk/benefits of screening. yes Discussion included potential follow up diagnostic testing for abnormal scans. yes Discussion included meaning and risk of over diagnosis. yes Discussion included meaning and risk of False Positives. yes Discussion included meaning of total radiation exposure. yes  Counseling Included: Importance of adherence to annual lung cancer LDCT screening. yes Impact of comorbidities on ability to participate in the program. yes Ability and willingness to under diagnostic treatment. yes  Smoking Cessation Counseling: Current Smokers:  Discussed importance of smoking cessation. yes Information about tobacco cessation classes and interventions provided to patient. yes Patient provided  with "ticket" for LDCT Scan. no Symptomatic Patient. no  Counseling(Intermediate counseling: > three minutes) 99406 Diagnosis Code: Tobacco Use Z72.0 Asymptomatic Patient yes  Counseling (Intermediate counseling: > three minutes counseling) [id number] Former Smokers:  Discussed the importance of maintaining cigarette abstinence. yes Diagnosis Code: Personal History of Nicotine  Dependence. W29.562 Information about tobacco cessation classes and interventions provided to patient. Yes Patient provided with "ticket" for LDCT Scan. no Written Order for Lung Cancer Screening with LDCT placed in Epic. Yes (CT Chest Lung Cancer Screening Low Dose W/O CM) [id number] Z12.2-Screening of respiratory organs Z87.891-Personal history of nicotine  dependence   Alyse Bach, RN

## 2024-04-24 NOTE — Patient Instructions (Signed)

## 2024-04-27 ENCOUNTER — Ambulatory Visit (HOSPITAL_COMMUNITY)
Admission: RE | Admit: 2024-04-27 | Discharge: 2024-04-27 | Disposition: A | Discharge status: Home / Self Care | Source: Ambulatory Visit | Attending: Family Medicine | Admitting: Family Medicine

## 2024-04-27 DIAGNOSIS — F17210 Nicotine dependence, cigarettes, uncomplicated: Secondary | ICD-10-CM | POA: Insufficient documentation

## 2024-04-27 DIAGNOSIS — Z122 Encounter for screening for malignant neoplasm of respiratory organs: Secondary | ICD-10-CM | POA: Insufficient documentation

## 2024-04-27 DIAGNOSIS — Z87891 Personal history of nicotine dependence: Secondary | ICD-10-CM | POA: Diagnosis not present

## 2024-05-06 ENCOUNTER — Ambulatory Visit (HOSPITAL_COMMUNITY)
Admission: RE | Admit: 2024-05-06 | Discharge: 2024-05-06 | Disposition: A | Discharge status: Home / Self Care | Source: Ambulatory Visit | Attending: Family Medicine | Admitting: Family Medicine

## 2024-05-06 DIAGNOSIS — Z1231 Encounter for screening mammogram for malignant neoplasm of breast: Secondary | ICD-10-CM | POA: Insufficient documentation

## 2024-05-15 ENCOUNTER — Other Ambulatory Visit: Payer: Self-pay

## 2024-05-15 DIAGNOSIS — F17210 Nicotine dependence, cigarettes, uncomplicated: Secondary | ICD-10-CM

## 2024-05-15 DIAGNOSIS — Z87891 Personal history of nicotine dependence: Secondary | ICD-10-CM

## 2024-05-15 DIAGNOSIS — Z122 Encounter for screening for malignant neoplasm of respiratory organs: Secondary | ICD-10-CM

## 2024-06-07 NOTE — Progress Notes (Unsigned)
 Cynthia [name], female    DOB: [birth year]    MRN: [id number]   Brief patient profile:  44 yowf  active smoker/MM   referred to pulmonary clinic in [place]  05/02/2023 by Norleen Marvine COME  for recurrent pna x 2019 and new cough x  March 2024.     History of Present Illness  05/02/2023  Pulmonary/ 1st office eval/ Jeovanny Cuadros / [facility]  on Breztri   Chief Complaint  Patient presents with   Consult    Pt consult referred by PCP for COPD evaluation, she currently has a productive cuogh w/ yellow sputum. Smokes 1-2ppd and is trying to quit. She reports that her O2 sats had been low but have since improved  Dyspnea:  limited by R hip pain / walks half mile around [place] x 30 min plus [employer] bike has to stop at of hills  Cough: first thing in am yellow/ thick no recent abx  x 30 min  Sleep: bed is flat/ lots of pillows  SABA use: not using hfa much/ uses neb more  02: none  Gen bilateral lower chest and abd pain just when coughing  Rec The key is to stop smoking completely before smoking completely stops you!  Augmentin  875 mg take one pill twice daily  X 10 days  Cough  >  Mucinex  dm 1200 mg twice daily as needed  Plan A = Automatic = Always=    Breztri  Take 2 puffs first thing in am and then another 2 puffs about 12 hours later.  Work on inhaler technique:   Plan B = Backup (to supplement plan A, not to replace it) Only use your albuterol  inhaler as a rescue medication Plan C = Crisis (instead of Plan B but only if Plan B stops working) - only use your albuterol  nebulizer if you first try Plan B and it fails to help > ok to use the nebulizer up to every 4 hours but if start needing it regularly call for immediate appointment Pantoprazole  (protonix ) 40 mg   Take  30-60 min before first meal of the day and Pepcid  (famotidine )  20 mg after supper until return to office GERD diet reviewed, bed blocks rec     05/02/23:  allergy screen Eos 0.1   alpha one AT phenotype  MM  level 159      01/16/2024  89m f/u ov/[place] [facility]/Zoria Rawlinson re: GOLD 2 copd /AB maint on no maint rx - thinks albuterol  works better than anything  Chief Complaint  Patient presents with   Follow-up    6 month follow up   Dyspnea:  mb and back stops half way / 75 ft slt uphill  Cough: better off breztri   Sleeping: flat bed /pillows no resp cc  SABA use: hfa up to 4 x daily  02: none  Lung cancer screening: should be due in April 2025  Rec Depomedrol 120 mg IM  Only use your albuterol  as a rescue medication  Also  Ok to try albuterol  15 min before an activity (on alternating days)  that you know would usually make you short of breath  Use your nebulizer up to every 4 hours if inhaler isn't working working for you and if this doesn't relieve your breathing difficulty then go to ER    Please schedule a follow up office visit in 2 weeks, sooner if needed  with all medications /inhalers/ solutions in hand    03/06/2024  f/u ov/[place] [facility]/Ella Golomb re: GOLD 2 copd/  AB  did  bring meds - no maint rx  Chief Complaint  Patient presents with   Follow-up    2 week follow up for COPD she said that she is feeling better   Dyspnea:  work in yard all day, does better s breztri  cause makes her cough Cough: none now  Sleeping: flat bed 2 pillows no resp cc  SABA use: none at present  02: none  Lung cancer screening: referred 03/06/24  Rec Plan A = Automatic = Always=    Breathe clean air Plan B = Backup (to supplement plan A, not to replace it) Only use your albuterol  inhaler as a rescue medication Work on inhaler technique:   Plan C = Crisis (instead of Plan B but only if Plan B stops working) - only use your albuterol /budesonide  0.25  nebulizer if you first try Plan B and it fails to help > ok to use the nebulizer up to every 4 hours but if start needing it regularly call for immediate appointment   LDSCT   04/27/24  RADS 2  Mild centrilobular emphysema   06/08/2024  f/u ov/[place] office/Shunna Mikaelian  re: GOLD 2 copd/ AB maint on no rx   Chief Complaint  Patient presents with   Follow-up   COPD   Dyspnea:  tolerating yardwork on good days including lots of push mowing  Cough: worse first thing am > comes up easy slt yellow = baseline > completely clears p an hour Sleeping: flat bed 2 pillows s    resp cc until am awakening at usual time SABA use: none  02: none   Lung cancer screening: 04/27/24  RADS 2/ Mild centrilobular emphysema    No obvious day to day or daytime variability or assoc  mucus plugs or hemoptysis or cp or chest tightness, subjective wheeze or overt sinus or hb symptoms.    Also denies any obvious fluctuation of symptoms with weather or environmental changes or other aggravating or alleviating factors except as outlined above   No unusual exposure hx or h/o childhood pna/ asthma or knowledge of premature birth.  Current Allergies, Complete Past Medical History, Past Surgical History, Family History, and Social History were reviewed in [employer] record.  ROS  The following are not active complaints unless bolded Hoarseness, sore throat, dysphagia, dental problems, itching, sneezing,  nasal congestion or discharge of excess mucus or purulent secretions, ear ache,   fever, chills, sweats, unintended wt loss or wt gain, classically pleuritic or exertional cp,  orthopnea pnd or arm/hand swelling  or leg swelling, presyncope, palpitations, abdominal pain, anorexia, nausea, vomiting, diarrhea  or change in bowel habits or change in bladder habits, change in stools or change in urine, dysuria, hematuria,  rash, arthralgias, visual complaints, headache, numbness, weakness or ataxia or problems with walking or coordination,  change in mood or  memory.        Current Meds  Medication Sig   albuterol  (PROAIR  HFA) 108 (90 Base) MCG/ACT inhaler 2 puffs every 4 hours as needed only  if your can't catch your breath   albuterol  (PROVENTIL ) (2.5 MG/3ML) 0.083%  nebulizer solution Take 3 mLs (2.5 mg total) by nebulization every 4 (four) hours as needed.   amLODipine -olmesartan  (AZOR ) 5-40 MG tablet Take 1 tablet by mouth daily.   aspirin  EC 81 MG tablet Take 81 mg by mouth daily.   budesonide  (PULMICORT ) 0.25 MG/2ML nebulizer solution One vial with each nebulizer up to 4 x daily  Cholecalciferol (VITAMIN D3) 50 MCG (2000 UT) capsule Take 2,000 Units by mouth daily.   icosapent  Ethyl (VASCEPA ) 1 g capsule Take 2 capsules (2 g total) by mouth 2 (two) times daily.   rosuvastatin  (CRESTOR ) 10 MG tablet Take 10 mg by mouth daily.                Past Medical History:  Diagnosis Date   Ascending aortic aneurysm (HCC) 04/25/2023   CAD in native artery 04/25/2023   Elevated liver enzymes    Elevated triglycerides with high cholesterol    Hypertension    Hypertriglyceridemia 04/25/2023   S/P tooth extraction 01/2013   for dentures   Tobacco use         Objective:    Wts  06/08/2024        204  03/06/2024        206  01/16/2024          216  06/19/2023       199  05/02/23 202 lb 9.6 oz (91.9 kg)  04/25/23 206 lb 4.8 oz (93.6 kg)  03/25/23 215 lb (97.5 kg)    Vital signs reviewed  06/08/2024  - Note at rest 02 sats  92% on RA   General appearance:    amb gruff voiced (baselin) mod obese (by bmi) wf nad     HEENT : Oropharynx  clear   Nasal turbinates nl    NECK :  without  apparent JVD/ palpable Nodes/TM    LUNGS: no acc muscle use,  Min barrel  contour chest wall with bilateral  slightly decreased bs s audible wheeze and  without cough on insp or exp maneuvers and min  Hyperresonant  to  percussion bilaterally    CV:  RRR  no s3 or murmur or increase in P2, and no edema   ABD:  soft and nontender with pos end  insp Hoover's  in the supine position.  No bruits or organomegaly appreciated   MS:  Nl gait/ ext warm without deformities Or obvious joint restrictions  calf tenderness, cyanosis or clubbing     SKIN: warm and dry without  lesions    NEURO:  alert, approp, nl sensorium with  no motor or cerebellar deficits apparent.            Assessment

## 2024-06-08 ENCOUNTER — Ambulatory Visit (INDEPENDENT_AMBULATORY_CARE_PROVIDER_SITE_OTHER): Admitting: Internal Medicine

## 2024-06-08 ENCOUNTER — Ambulatory Visit: Admitting: Nurse Practitioner

## 2024-06-08 ENCOUNTER — Encounter: Payer: Self-pay | Admitting: Internal Medicine

## 2024-06-08 VITALS — BP 100/64 | HR 85 | Ht 66.0 in | Wt 204.4 lb

## 2024-06-08 DIAGNOSIS — J449 Chronic obstructive pulmonary disease, unspecified: Secondary | ICD-10-CM | POA: Diagnosis not present

## 2024-06-08 DIAGNOSIS — F17210 Nicotine dependence, cigarettes, uncomplicated: Secondary | ICD-10-CM | POA: Diagnosis not present

## 2024-06-08 NOTE — Patient Instructions (Addendum)
 Plan A = Automatic = Always=    Breathe clean air (stop all smoking if possible)   Plan B = Backup (to supplement plan A, not to replace it) Only use your albuterol  inhaler as a rescue medication to be used if you can't catch your breath by resting or doing a relaxed purse lip breathing pattern.  - The less you use it, the better it will work when you need it. - Ok to use the inhaler up to 2 puffs  every 4 hours if you must but call for appointment if use goes up over your usual need - Don't leave home without it !!  (think of it like the spare tire for your car)   Work on inhaler technique:  relax and gently blow all the way out then take a nice smooth full deep breath back in, triggering the inhaler at same time you start breathing in.  Hold breath in for at least  5 seconds if you can.       Plan C = Crisis (instead of Plan B but only if Plan B stops working) - only use your albuterol /budesonide  0.25  nebulizer if you first try Plan B and it fails to help > ok to use the nebulizer up to every 4 hours but if start needing it regularly call for immediate appointment   Plan D = Doctor - call me or Dr Marvine  if B and C not adequate  Please schedule a follow up visit in 12  months but call sooner if needed

## 2024-06-08 NOTE — Assessment & Plan Note (Signed)
 Active smoker/MM - emphysema on CT 04/09/23  - Labs ordered 05/02/2023  :  allergy screen Eos 0.1   alpha one AT phenotype  MM  level 159  - 05/02/2023  After extensive coaching inhaler device,  effectiveness =    60% (short ti) > continue breztri / rx rhinitis/ sinusitis with augmentin  and empirical gerd x 6 weeks then regroup - PFT's  05/31/23  FEV1 1.54 (54 % ) ratio 0.66  p 0 % improvement from saba p no Breztri  prior to study with DLCO  13.37 (66%)   and FV curve mild but classically concave  and ERV 51% at wt 197   - 06/19/2023  After extensive coaching inhaler device,  effectiveness =   50% > continue breztri   - 01/16/2024 pt d/c'd breztri  felt better off it due to cough  - 06/08/2024  After extensive coaching inhaler device,  effectiveness =    75% from a baseline of 25% so continue prn saba hfa  and saba/bud neb as back up but for now no need for maint rx   Despite ongoing smoking and poor hfa baseline, doing very well with activity s aecopd so no change in rx needed  F/u will be yearly  - call sooner if needed         Each maintenance medication was reviewed in detail including emphasizing most importantly the difference between maintenance and prns and under what circumstances the prns are to be triggered using an action plan format where appropriate.  Total time for H and P, chart review, counseling, reviewing hfa /neb device(s) and generating customized AVS unique to this office visit / same day charting = 20 min

## 2024-06-08 NOTE — Assessment & Plan Note (Signed)
 4-5 min discussion re active cigarette smoking in addition to office E&M  Ask about tobacco use:  ongoing  Advise quitting   I took an extended  opportunity with this patient to outline the consequences of continued cigarette use  in airway disorders based on all the data we have from the multiple national lung health studies (perfomed over decades at millions of dollars in cost)  indicating that smoking cessation, not choice of inhalers or pulmonary physicians, is the most important aspect of her  care and especially should help her AM cough w/in 6 weeks if successful at quitting Assess willingness:  Not committed at this point Assist in quit attempt:  Per PCP when ready Arrange follow up:   Follow up per Primary Care planned  For smoking cessation classes call [phone]

## 2024-06-15 ENCOUNTER — Other Ambulatory Visit (HOSPITAL_BASED_OUTPATIENT_CLINIC_OR_DEPARTMENT_OTHER): Payer: Self-pay | Admitting: Family

## 2024-06-18 ENCOUNTER — Other Ambulatory Visit (HOSPITAL_BASED_OUTPATIENT_CLINIC_OR_DEPARTMENT_OTHER): Payer: Self-pay | Admitting: Cardiovascular Disease

## 2024-07-15 ENCOUNTER — Other Ambulatory Visit (HOSPITAL_BASED_OUTPATIENT_CLINIC_OR_DEPARTMENT_OTHER): Payer: Self-pay | Admitting: Family

## 2024-07-15 DIAGNOSIS — I10 Essential (primary) hypertension: Secondary | ICD-10-CM

## 2024-11-02 ENCOUNTER — Telehealth: Payer: Self-pay | Admitting: Cardiovascular Disease

## 2024-11-02 NOTE — Telephone Encounter (Signed)
  Pt c/o swelling/edema: STAT if pt has developed SOB within 24 hours  If swelling, where is the swelling located? Lower legs has more swelling than usual and she states her skin turns red and feels like it burns  How much weight have you gained and in what time span? no  Have you gained 2 pounds in a day or 5 pounds in a week? no  Do you have a log of your daily weights (if so, list)? no  Are you currently taking a fluid pill? No, says she can't take them  Are you currently SOB? States she has COPD so she has that anyway  Have you traveled recently in a car or plane for an extended period of time? No

## 2024-11-02 NOTE — Telephone Encounter (Signed)
 Called patient. She did not expect a call back.  She was just calling to make her 6 month follow up.  She's not scheduled in Feb.  Offered sooner (tomorrow) but she said 3:305 is too late in the day for her.  She lives in [place] and needs to get back home before it gets dark.  Suggested minimizing sodium, she said she's had discoloration in her legs but the swelling is new.  Again suggested ways to get less sodium and also recommended compression socks.  She can't stand them because they cut in to her legs.  She puts a heated blanket on warm and puts that over her legs and reclines and this seems to help.  She is not requesting a sooner visit and and satisfied waiting until February.

## 2024-12-18 ENCOUNTER — Other Ambulatory Visit (HOSPITAL_BASED_OUTPATIENT_CLINIC_OR_DEPARTMENT_OTHER): Payer: Self-pay | Admitting: Family

## 2025-01-15 ENCOUNTER — Ambulatory Visit (HOSPITAL_BASED_OUTPATIENT_CLINIC_OR_DEPARTMENT_OTHER): Admitting: Family

## 2025-02-09 ENCOUNTER — Ambulatory Visit (HOSPITAL_BASED_OUTPATIENT_CLINIC_OR_DEPARTMENT_OTHER): Admitting: Family

## 2025-03-03 ENCOUNTER — Ambulatory Visit
